# Patient Record
Sex: Male | Born: 1935 | Race: White | Hispanic: No | Marital: Married | Smoking: Former smoker
Health system: Southern US, Community
[De-identification: ages and names within clinical notes are randomized; demographics above are authoritative.]

## PROBLEM LIST (undated history)

## (undated) DIAGNOSIS — F1010 Alcohol abuse, uncomplicated: Secondary | ICD-10-CM

## (undated) DIAGNOSIS — I10 Essential (primary) hypertension: Secondary | ICD-10-CM

## (undated) DIAGNOSIS — E119 Type 2 diabetes mellitus without complications: Secondary | ICD-10-CM

## (undated) DIAGNOSIS — E785 Hyperlipidemia, unspecified: Secondary | ICD-10-CM

## (undated) DIAGNOSIS — F0280 Dementia in other diseases classified elsewhere without behavioral disturbance: Secondary | ICD-10-CM

---

## 2000-12-05 ENCOUNTER — Encounter: Admission: RE | Admit: 2000-12-05 | Discharge: 2000-12-05 | Payer: Self-pay | Admitting: Internal Medicine

## 2000-12-05 ENCOUNTER — Encounter: Payer: Self-pay | Admitting: Internal Medicine

## 2001-02-03 ENCOUNTER — Ambulatory Visit (HOSPITAL_COMMUNITY): Admission: RE | Admit: 2001-02-03 | Discharge: 2001-02-03 | Payer: Self-pay | Admitting: *Deleted

## 2019-07-21 ENCOUNTER — Inpatient Hospital Stay (HOSPITAL_COMMUNITY): Payer: Medicare Other

## 2019-07-21 ENCOUNTER — Inpatient Hospital Stay (HOSPITAL_COMMUNITY)
Admission: EM | Admit: 2019-07-21 | Discharge: 2019-08-02 | DRG: 521 | Disposition: A | Discharge status: Skilled Nursing Facility | Payer: Medicare Other | Source: Skilled Nursing Facility | Attending: Student | Admitting: Student

## 2019-07-21 ENCOUNTER — Encounter (HOSPITAL_COMMUNITY): Payer: Self-pay

## 2019-07-21 ENCOUNTER — Emergency Department (HOSPITAL_COMMUNITY): Payer: Medicare Other

## 2019-07-21 DIAGNOSIS — F1010 Alcohol abuse, uncomplicated: Secondary | ICD-10-CM | POA: Diagnosis present

## 2019-07-21 DIAGNOSIS — G301 Alzheimer's disease with late onset: Secondary | ICD-10-CM | POA: Diagnosis present

## 2019-07-21 DIAGNOSIS — J810 Acute pulmonary edema: Secondary | ICD-10-CM

## 2019-07-21 DIAGNOSIS — I4891 Unspecified atrial fibrillation: Secondary | ICD-10-CM | POA: Diagnosis not present

## 2019-07-21 DIAGNOSIS — R54 Age-related physical debility: Secondary | ICD-10-CM | POA: Diagnosis present

## 2019-07-21 DIAGNOSIS — I10 Essential (primary) hypertension: Secondary | ICD-10-CM | POA: Diagnosis present

## 2019-07-21 DIAGNOSIS — L89151 Pressure ulcer of sacral region, stage 1: Secondary | ICD-10-CM | POA: Diagnosis present

## 2019-07-21 DIAGNOSIS — N179 Acute kidney failure, unspecified: Secondary | ICD-10-CM | POA: Diagnosis not present

## 2019-07-21 DIAGNOSIS — E1165 Type 2 diabetes mellitus with hyperglycemia: Secondary | ICD-10-CM | POA: Diagnosis present

## 2019-07-21 DIAGNOSIS — J189 Pneumonia, unspecified organism: Secondary | ICD-10-CM | POA: Diagnosis not present

## 2019-07-21 DIAGNOSIS — S72031A Displaced midcervical fracture of right femur, initial encounter for closed fracture: Secondary | ICD-10-CM | POA: Diagnosis not present

## 2019-07-21 DIAGNOSIS — I5021 Acute systolic (congestive) heart failure: Secondary | ICD-10-CM | POA: Diagnosis not present

## 2019-07-21 DIAGNOSIS — G9341 Metabolic encephalopathy: Secondary | ICD-10-CM | POA: Diagnosis present

## 2019-07-21 DIAGNOSIS — Z515 Encounter for palliative care: Secondary | ICD-10-CM | POA: Diagnosis not present

## 2019-07-21 DIAGNOSIS — Z419 Encounter for procedure for purposes other than remedying health state, unspecified: Secondary | ICD-10-CM

## 2019-07-21 DIAGNOSIS — D696 Thrombocytopenia, unspecified: Secondary | ICD-10-CM | POA: Diagnosis not present

## 2019-07-21 DIAGNOSIS — I2510 Atherosclerotic heart disease of native coronary artery without angina pectoris: Secondary | ICD-10-CM | POA: Diagnosis present

## 2019-07-21 DIAGNOSIS — R0603 Acute respiratory distress: Secondary | ICD-10-CM

## 2019-07-21 DIAGNOSIS — R0602 Shortness of breath: Secondary | ICD-10-CM

## 2019-07-21 DIAGNOSIS — Z01810 Encounter for preprocedural cardiovascular examination: Secondary | ICD-10-CM | POA: Diagnosis not present

## 2019-07-21 DIAGNOSIS — Z66 Do not resuscitate: Secondary | ICD-10-CM | POA: Diagnosis present

## 2019-07-21 DIAGNOSIS — I4819 Other persistent atrial fibrillation: Secondary | ICD-10-CM | POA: Diagnosis present

## 2019-07-21 DIAGNOSIS — S72001A Fracture of unspecified part of neck of right femur, initial encounter for closed fracture: Secondary | ICD-10-CM | POA: Diagnosis present

## 2019-07-21 DIAGNOSIS — I509 Heart failure, unspecified: Secondary | ICD-10-CM | POA: Diagnosis not present

## 2019-07-21 DIAGNOSIS — J9601 Acute respiratory failure with hypoxia: Secondary | ICD-10-CM | POA: Diagnosis not present

## 2019-07-21 DIAGNOSIS — F0280 Dementia in other diseases classified elsewhere without behavioral disturbance: Secondary | ICD-10-CM | POA: Diagnosis present

## 2019-07-21 DIAGNOSIS — R32 Unspecified urinary incontinence: Secondary | ICD-10-CM | POA: Diagnosis present

## 2019-07-21 DIAGNOSIS — Z79899 Other long term (current) drug therapy: Secondary | ICD-10-CM

## 2019-07-21 DIAGNOSIS — Y95 Nosocomial condition: Secondary | ICD-10-CM | POA: Diagnosis present

## 2019-07-21 DIAGNOSIS — R52 Pain, unspecified: Secondary | ICD-10-CM

## 2019-07-21 DIAGNOSIS — I493 Ventricular premature depolarization: Secondary | ICD-10-CM | POA: Diagnosis not present

## 2019-07-21 DIAGNOSIS — W19XXXA Unspecified fall, initial encounter: Secondary | ICD-10-CM | POA: Diagnosis present

## 2019-07-21 DIAGNOSIS — E876 Hypokalemia: Secondary | ICD-10-CM | POA: Diagnosis not present

## 2019-07-21 DIAGNOSIS — T148XXA Other injury of unspecified body region, initial encounter: Secondary | ICD-10-CM | POA: Diagnosis present

## 2019-07-21 DIAGNOSIS — E875 Hyperkalemia: Secondary | ICD-10-CM | POA: Diagnosis not present

## 2019-07-21 DIAGNOSIS — Z885 Allergy status to narcotic agent status: Secondary | ICD-10-CM

## 2019-07-21 DIAGNOSIS — Z20822 Contact with and (suspected) exposure to covid-19: Secondary | ICD-10-CM | POA: Diagnosis not present

## 2019-07-21 DIAGNOSIS — Y92122 Bedroom in nursing home as the place of occurrence of the external cause: Secondary | ICD-10-CM

## 2019-07-21 DIAGNOSIS — Z5309 Procedure and treatment not carried out because of other contraindication: Secondary | ICD-10-CM | POA: Diagnosis not present

## 2019-07-21 DIAGNOSIS — S72009A Fracture of unspecified part of neck of unspecified femur, initial encounter for closed fracture: Secondary | ICD-10-CM | POA: Diagnosis present

## 2019-07-21 DIAGNOSIS — E785 Hyperlipidemia, unspecified: Secondary | ICD-10-CM | POA: Diagnosis present

## 2019-07-21 DIAGNOSIS — Z87891 Personal history of nicotine dependence: Secondary | ICD-10-CM

## 2019-07-21 DIAGNOSIS — R109 Unspecified abdominal pain: Secondary | ICD-10-CM

## 2019-07-21 DIAGNOSIS — E440 Moderate protein-calorie malnutrition: Secondary | ICD-10-CM | POA: Diagnosis present

## 2019-07-21 DIAGNOSIS — Z6823 Body mass index (BMI) 23.0-23.9, adult: Secondary | ICD-10-CM

## 2019-07-21 DIAGNOSIS — L8990 Pressure ulcer of unspecified site, unspecified stage: Secondary | ICD-10-CM | POA: Insufficient documentation

## 2019-07-21 DIAGNOSIS — N401 Enlarged prostate with lower urinary tract symptoms: Secondary | ICD-10-CM | POA: Diagnosis not present

## 2019-07-21 DIAGNOSIS — Z888 Allergy status to other drugs, medicaments and biological substances status: Secondary | ICD-10-CM

## 2019-07-21 DIAGNOSIS — I517 Cardiomegaly: Secondary | ICD-10-CM | POA: Diagnosis not present

## 2019-07-21 DIAGNOSIS — A419 Sepsis, unspecified organism: Secondary | ICD-10-CM | POA: Diagnosis present

## 2019-07-21 DIAGNOSIS — R1310 Dysphagia, unspecified: Secondary | ICD-10-CM | POA: Diagnosis present

## 2019-07-21 DIAGNOSIS — Z9181 History of falling: Secondary | ICD-10-CM

## 2019-07-21 DIAGNOSIS — R6510 Systemic inflammatory response syndrome (SIRS) of non-infectious origin without acute organ dysfunction: Secondary | ICD-10-CM | POA: Diagnosis present

## 2019-07-21 DIAGNOSIS — T17908A Unspecified foreign body in respiratory tract, part unspecified causing other injury, initial encounter: Secondary | ICD-10-CM

## 2019-07-21 DIAGNOSIS — E870 Hyperosmolality and hypernatremia: Secondary | ICD-10-CM | POA: Diagnosis not present

## 2019-07-21 DIAGNOSIS — R339 Retention of urine, unspecified: Secondary | ICD-10-CM | POA: Diagnosis not present

## 2019-07-21 DIAGNOSIS — R338 Other retention of urine: Secondary | ICD-10-CM | POA: Diagnosis present

## 2019-07-21 DIAGNOSIS — Y92129 Unspecified place in nursing home as the place of occurrence of the external cause: Secondary | ICD-10-CM | POA: Diagnosis not present

## 2019-07-21 DIAGNOSIS — I110 Hypertensive heart disease with heart failure: Secondary | ICD-10-CM | POA: Diagnosis present

## 2019-07-21 DIAGNOSIS — Z955 Presence of coronary angioplasty implant and graft: Secondary | ICD-10-CM

## 2019-07-21 DIAGNOSIS — F0391 Unspecified dementia with behavioral disturbance: Secondary | ICD-10-CM | POA: Diagnosis not present

## 2019-07-21 DIAGNOSIS — Z96649 Presence of unspecified artificial hip joint: Secondary | ICD-10-CM

## 2019-07-21 HISTORY — DX: Type 2 diabetes mellitus without complications: E11.9

## 2019-07-21 HISTORY — DX: Dementia in other diseases classified elsewhere, unspecified severity, without behavioral disturbance, psychotic disturbance, mood disturbance, and anxiety: F02.80

## 2019-07-21 HISTORY — DX: Alcohol abuse, uncomplicated: F10.10

## 2019-07-21 HISTORY — DX: Hyperlipidemia, unspecified: E78.5

## 2019-07-21 HISTORY — DX: Essential (primary) hypertension: I10

## 2019-07-21 LAB — BASIC METABOLIC PANEL
Anion gap: 11 (ref 5–15)
Anion gap: 16 — ABNORMAL HIGH (ref 5–15)
BUN: 22 mg/dL (ref 8–23)
BUN: 21 mg/dL (ref 8–23)
CO2: 26 mmol/L (ref 22–32)
CO2: 21 mmol/L — ABNORMAL LOW (ref 22–32)
Calcium: 8.9 mg/dL (ref 8.9–10.3)
Calcium: 9.1 mg/dL (ref 8.9–10.3)
Chloride: 98 mmol/L (ref 98–111)
Chloride: 102 mmol/L (ref 98–111)
Creatinine, Ser: 0.81 mg/dL (ref 0.61–1.24)
Creatinine, Ser: 0.99 mg/dL (ref 0.61–1.24)
GFR calc Af Amer: >60 mL/min (ref >60)
GFR calc Af Amer: >60 mL/min (ref >60)
GFR calc non Af Amer: >60 mL/min (ref >60)
GFR calc non Af Amer: >60 mL/min (ref >60)
Glucose, Bld: 126 mg/dL — ABNORMAL HIGH (ref 70–99)
Glucose, Bld: 149 mg/dL — ABNORMAL HIGH (ref 70–99)
Potassium: 5.4 mmol/L — ABNORMAL HIGH (ref 3.5–5.1)
Potassium: 4.3 mmol/L (ref 3.5–5.1)
Sodium: 135 mmol/L (ref 135–145)
Sodium: 139 mmol/L (ref 135–145)

## 2019-07-21 LAB — TROPONIN I (HIGH SENSITIVITY)
Troponin I (High Sensitivity): 29 ng/L — ABNORMAL HIGH (ref <18)
Troponin I (High Sensitivity): 32 ng/L — ABNORMAL HIGH (ref <18)

## 2019-07-21 LAB — COMPREHENSIVE METABOLIC PANEL
ALT: 14 U/L (ref 0–44)
AST: 27 U/L (ref 15–41)
Albumin: 3.5 g/dL (ref 3.5–5.0)
Alkaline Phosphatase: 80 U/L (ref 38–126)
Anion gap: 13 (ref 5–15)
BUN: 18 mg/dL (ref 8–23)
CO2: 26 mmol/L (ref 22–32)
Calcium: 8.6 mg/dL — ABNORMAL LOW (ref 8.9–10.3)
Chloride: 99 mmol/L (ref 98–111)
Creatinine, Ser: 0.98 mg/dL (ref 0.61–1.24)
GFR calc Af Amer: >60 mL/min (ref >60)
GFR calc non Af Amer: >60 mL/min (ref >60)
Glucose, Bld: 150 mg/dL — ABNORMAL HIGH (ref 70–99)
Potassium: 5 mmol/L (ref 3.5–5.1)
Sodium: 138 mmol/L (ref 135–145)
Total Bilirubin: 2.9 mg/dL — ABNORMAL HIGH (ref 0.3–1.2)
Total Protein: 5.9 g/dL — ABNORMAL LOW (ref 6.5–8.1)

## 2019-07-21 LAB — PROTIME-INR
INR: 1.2 (ref 0.8–1.2)
Prothrombin Time: 14.7 seconds (ref 11.4–15.2)

## 2019-07-21 LAB — BRAIN NATRIURETIC PEPTIDE: B Natriuretic Peptide: 655.7 pg/mL — ABNORMAL HIGH (ref 0.0–100.0)

## 2019-07-21 LAB — CBC WITH DIFFERENTIAL/PLATELET
Abs Immature Granulocytes: 0.12 10*3/uL — ABNORMAL HIGH (ref 0.00–0.07)
Abs Immature Granulocytes: 0.14 10*3/uL — ABNORMAL HIGH (ref 0.00–0.07)
Basophils Absolute: 0 10*3/uL (ref 0.0–0.1)
Basophils Absolute: 0.1 10*3/uL (ref 0.0–0.1)
Basophils Relative: 0 %
Basophils Relative: 0 %
Eosinophils Absolute: 0 10*3/uL (ref 0.0–0.5)
Eosinophils Absolute: 0 10*3/uL (ref 0.0–0.5)
Eosinophils Relative: 0 %
Eosinophils Relative: 0 %
HCT: 43.4 % (ref 39.0–52.0)
HCT: 45.1 % (ref 39.0–52.0)
Hemoglobin: 14.7 g/dL (ref 13.0–17.0)
Hemoglobin: 14.6 g/dL (ref 13.0–17.0)
Immature Granulocytes: 1 %
Immature Granulocytes: 1 %
Lymphocytes Relative: 6 %
Lymphocytes Relative: 7 %
Lymphs Abs: 1.1 10*3/uL (ref 0.7–4.0)
Lymphs Abs: 1.4 10*3/uL (ref 0.7–4.0)
MCH: 32.7 pg (ref 26.0–34.0)
MCH: 32.7 pg (ref 26.0–34.0)
MCHC: 33.9 g/dL (ref 30.0–36.0)
MCHC: 32.4 g/dL (ref 30.0–36.0)
MCV: 96.4 fL (ref 80.0–100.0)
MCV: 100.9 fL — ABNORMAL HIGH (ref 80.0–100.0)
Monocytes Absolute: 1.5 10*3/uL — ABNORMAL HIGH (ref 0.1–1.0)
Monocytes Absolute: 1.6 10*3/uL — ABNORMAL HIGH (ref 0.1–1.0)
Monocytes Relative: 9 %
Monocytes Relative: 9 %
Neutro Abs: 14.5 10*3/uL — ABNORMAL HIGH (ref 1.7–7.7)
Neutro Abs: 15.4 10*3/uL — ABNORMAL HIGH (ref 1.7–7.7)
Neutrophils Relative %: 84 %
Neutrophils Relative %: 83 %
Platelets: 200 10*3/uL (ref 150–400)
Platelets: 209 10*3/uL (ref 150–400)
RBC: 4.5 MIL/uL (ref 4.22–5.81)
RBC: 4.47 MIL/uL (ref 4.22–5.81)
RDW: 14.5 % (ref 11.5–15.5)
RDW: 14.9 % (ref 11.5–15.5)
WBC: 17.2 10*3/uL — ABNORMAL HIGH (ref 4.0–10.5)
WBC: 18.6 10*3/uL — ABNORMAL HIGH (ref 4.0–10.5)
nRBC: 0 % (ref 0.0–0.2)
nRBC: 0 % (ref 0.0–0.2)

## 2019-07-21 LAB — TYPE AND SCREEN
ABO/RH(D): O POS
Antibody Screen: NEGATIVE

## 2019-07-21 LAB — URINALYSIS, ROUTINE W REFLEX MICROSCOPIC
Bilirubin Urine: NEGATIVE
Glucose, UA: NEGATIVE mg/dL
Hgb urine dipstick: NEGATIVE
Ketones, ur: 20 mg/dL — AB
Leukocytes,Ua: NEGATIVE
Nitrite: NEGATIVE
Protein, ur: 30 mg/dL — AB
Specific Gravity, Urine: 1.031 — ABNORMAL HIGH (ref 1.005–1.030)
pH: 5 (ref 5.0–8.0)

## 2019-07-21 LAB — RESPIRATORY PANEL BY RT PCR (FLU A&B, COVID)
Influenza A by PCR: NEGATIVE
Influenza B by PCR: NEGATIVE
SARS Coronavirus 2 by RT PCR: NEGATIVE

## 2019-07-21 LAB — VALPROIC ACID LEVEL: Valproic Acid Lvl: <10 ug/mL — ABNORMAL LOW (ref 50.0–100.0)

## 2019-07-21 LAB — GLUCOSE, CAPILLARY
Glucose-Capillary: 162 mg/dL — ABNORMAL HIGH (ref 70–99)
Glucose-Capillary: 152 mg/dL — ABNORMAL HIGH (ref 70–99)

## 2019-07-21 LAB — TSH: TSH: 5.813 u[IU]/mL — ABNORMAL HIGH (ref 0.350–4.500)

## 2019-07-21 LAB — PROCALCITONIN: Procalcitonin: <0.1 ng/mL

## 2019-07-21 LAB — ABO/RH: ABO/RH(D): O POS

## 2019-07-21 MED ORDER — LORAZEPAM 2 MG/ML IJ SOLN
0.5000 mg | INTRAMUSCULAR | Status: DC | PRN
  Administered 2019-07-21 – 2019-07-22 (×4): 1 mg via INTRAVENOUS
  Filled 2019-07-21 (×4): qty 1

## 2019-07-21 MED ORDER — TRANEXAMIC ACID-NACL 1000-0.7 MG/100ML-% IV SOLN
1000.0000 mg | INTRAVENOUS | Status: AC
  Filled 2019-07-21 (×2): qty 100

## 2019-07-21 MED ORDER — PRO-STAT SUGAR FREE PO LIQD
30.0000 mL | Freq: Three times a day (TID) | ORAL | Status: DC
  Administered 2019-07-23 – 2019-07-31 (×17): 30 mL via ORAL
  Filled 2019-07-21 (×24): qty 30

## 2019-07-21 MED ORDER — METOPROLOL TARTRATE 5 MG/5ML IV SOLN
2.5000 mg | Freq: Four times a day (QID) | INTRAVENOUS | Status: DC | PRN
  Administered 2019-07-21: 21:00:00 2.5 mg via INTRAVENOUS
  Filled 2019-07-21: qty 5

## 2019-07-21 MED ORDER — ADULT MULTIVITAMIN W/MINERALS CH
1.0000 | ORAL_TABLET | Freq: Every day | ORAL | Status: DC
  Administered 2019-07-24 – 2019-07-25 (×2): 1 via ORAL
  Filled 2019-07-21 (×3): qty 1

## 2019-07-21 MED ORDER — FUROSEMIDE 10 MG/ML IJ SOLN
40.0000 mg | Freq: Once | INTRAMUSCULAR | Status: AC
  Administered 2019-07-21: 05:00:00 40 mg via INTRAVENOUS
  Filled 2019-07-21: qty 4

## 2019-07-21 MED ORDER — CEFAZOLIN SODIUM-DEXTROSE 2-4 GM/100ML-% IV SOLN
2.0000 g | INTRAVENOUS | Status: DC
  Filled 2019-07-21 (×2): qty 100

## 2019-07-21 MED ORDER — MORPHINE SULFATE (PF) 2 MG/ML IV SOLN
1.0000 mg | INTRAVENOUS | Status: DC | PRN
  Administered 2019-07-21 – 2019-07-26 (×4): 1 mg via INTRAVENOUS
  Filled 2019-07-21 (×5): qty 1

## 2019-07-21 MED ORDER — MORPHINE SULFATE (PF) 2 MG/ML IV SOLN
0.5000 mg | INTRAVENOUS | Status: DC | PRN
  Administered 2019-07-21: 08:00:00 0.5 mg via INTRAVENOUS
  Filled 2019-07-21: qty 1

## 2019-07-21 MED ORDER — ENSURE PRE-SURGERY PO LIQD
296.0000 mL | Freq: Once | ORAL | Status: DC
  Filled 2019-07-21: qty 296

## 2019-07-21 MED ORDER — MORPHINE SULFATE (PF) 2 MG/ML IV SOLN
1.0000 mg | Freq: Once | INTRAVENOUS | Status: AC
  Administered 2019-07-21: 09:00:00 1 mg via INTRAVENOUS

## 2019-07-21 MED ORDER — ENSURE ENLIVE PO LIQD
237.0000 mL | Freq: Three times a day (TID) | ORAL | Status: DC
  Administered 2019-07-23 – 2019-07-27 (×6): 237 mL via ORAL

## 2019-07-21 MED ORDER — TRANEXAMIC ACID 1000 MG/10ML IV SOLN
2000.0000 mg | INTRAVENOUS | Status: DC
  Filled 2019-07-21: qty 20

## 2019-07-21 MED ORDER — METOPROLOL TARTRATE 25 MG PO TABS
25.0000 mg | ORAL_TABLET | Freq: Two times a day (BID) | ORAL | Status: DC
  Administered 2019-07-21: 12:00:00 25 mg via ORAL
  Filled 2019-07-21 (×2): qty 1

## 2019-07-21 MED ORDER — METOPROLOL TARTRATE 5 MG/5ML IV SOLN: 5.0000 mg | Freq: Four times a day (QID) | INTRAVENOUS | Status: DC | PRN

## 2019-07-21 MED ORDER — POVIDONE-IODINE 10 % EX SWAB: 2.0000 "application " | Freq: Once | CUTANEOUS | Status: DC

## 2019-07-21 MED ORDER — FUROSEMIDE 10 MG/ML IJ SOLN
40.0000 mg | Freq: Once | INTRAMUSCULAR | Status: AC
  Administered 2019-07-21: 16:00:00 40 mg via INTRAVENOUS
  Filled 2019-07-21: qty 4

## 2019-07-21 MED ORDER — TRANEXAMIC ACID-NACL 1000-0.7 MG/100ML-% IV SOLN
1000.0000 mg | INTRAVENOUS | Status: DC
  Filled 2019-07-21: qty 100

## 2019-07-21 NOTE — Progress Notes (Addendum)
Echo attempted. Patient agitated and HR in too high at this time. Will attempt once HR is in a reasonable range.

## 2019-07-21 NOTE — Progress Notes (Signed)
S- Went to see pt. He had de-saturated during day, was working harder to breathe earlier. CXR showed edema. Received only 1 dose of Lasix. No complaints from pt, hopefully getting surgery tomorrow. O- +LE edema. Pleasantly demented with me. Faint rales at bases. HR irreg irreg, tachy. Appears comfortable.  UA unremarkable, CXR not suggestive of PNA, no fevers. A- Acute on chronic HF, ARF, A fib, Hip fx, dysphagia P- will give another dose of Lasix. O2 as needed. I think once we get some fluid off of him, his HR will improve. Additionally, I think pain is contributing to his HR/RR. Hopefully can get him calm enough to get echo. Will defer to cards team regarding metoprolol and difficulty swallowing.   Jilda Roche Magic Mohler 3:51 PM 07/21/19

## 2019-07-21 NOTE — Progress Notes (Signed)
CSW contacted [facility]. Spoke with Alphonzo Lemmings [phone] who verified [facility] is [facility].   [address] [place], [place]  [phone]

## 2019-07-21 NOTE — ED Provider Notes (Signed)
TIME SEEN: 2:41 AM  CHIEF COMPLAINT: Fall, right hip fracture  HPI: Patient is an 86 year old male with history of Alzheimer's dementia, hypertension, diabetes, hyperlipidemia who presents from [place] at [facility] after a fall.  Spoke with Renea Ee who has been caring for patient [phone], [phone]) who states over the past several days he has had several falls.  He last fell last night and was complaining of hip pain.  This fall was unwitnessed and he was found sitting on his bottom between the wall and the bed.  It is unclear if he hit his head.  He is not on blood thinners.  X-ray was obtained of the right hip at the nursing facility which was concerning for right femoral neck fracture and he was transferred here.  Spoke with patient's son Fayrene Fearing [phone]) who is patient's power of attorney.  He states that patient started having cough and shortness of breath 2 days ago.  Was initially diagnosed with pneumonia but then felt thought to be secondary to volume overload.  He is not currently on antibiotics.  States he had a fall last night and they told him that patient had a left hip fracture.  He reports that the patient normally is able to ambulate with a walker.  It is not abnormal for him to have frequent falls.  He states patient is a DNR.  They would want surgical intervention if needed.  ROS: Level 5 caveat secondary to dementia  PAST MEDICAL HISTORY/PAST SURGICAL HISTORY:  Past Medical History:  Diagnosis Date  . Alcohol abuse   . Alzheimer's dementia, late onset (HCC)   . Hyperlipemia   . Hypertension   . Type 2 diabetes mellitus (HCC)     MEDICATIONS:  Prior to Admission medications   Not on File    ALLERGIES:  Allergies  Allergen Reactions  . Atorvastatin   . Codeine     SOCIAL HISTORY:  Social History   Tobacco Use  . Smoking status: Not on file  Substance Use Topics  . Alcohol use: Not on file    FAMILY HISTORY: No family  history on file.  EXAM: BP (!) 151/96 (BP Location: Right Arm)   Pulse (!) 108   Temp 98.6 F (37 C) (Oral)   Resp (!) 30   SpO2 94%  CONSTITUTIONAL: Alert.  Patient is elderly and demented.  He is currently rambling.  Unable to answer questions or follow commands. HEAD: Normocephalic; atraumatic EYES: Conjunctivae clear, PERRL, EOMI ENT: normal nose; no rhinorrhea; moist mucous membranes; pharynx without lesions noted; no dental injury; no septal hematoma NECK: Supple, no meningismus, no LAD; no midline spinal tenderness, step-off or deformity; trachea midline CARD: Irregularly irregular and minimally tachycardic; S1 and S2 appreciated; no murmurs, no clicks, no rubs, no gallops RESP: Normal chest excursion without splinting or tachypnea; breath sounds clear and equal bilaterally; no wheezes, no rhonchi, no rales; no hypoxia or respiratory distress CHEST:  chest wall stable, no crepitus or deformity, patient has ecchymosis to the anterior chest, nontender to palpation; no flail chest ABD/GI: Normal bowel sounds; non-distended; soft, non-tender, no rebound, no guarding; no ecchymosis or other lesions noted PELVIS:  stable, tender over the right hip and this leg is shortened and externally rotated BACK:  The back appears normal and is non-tender to palpation, there is no CVA tenderness; no midline spinal tenderness, step-off or deformity, bruising noted to the posterior right shoulder EXT: No other deformity noted to his extremities other than right hip  pain with external rotation and shortening.  His extremities are all warm and well-perfused.  He has 2+ peripheral edema to the midshin bilaterally. SKIN: Normal color for age and race; warm NEURO: Moves all extremities equally, rambling but speech is clear, no facial asymmetry  MEDICAL DECISION MAKING: Patient here with unwitnessed fall.  Concern for right femoral neck fracture at nursing home.  Clinically it does appear he has a hip fracture.   He appears to be perfusing this extremity well.  Does not have a local orthopedist.  Will obtain imaging of the head, cervical spine given fall unwitnessed.  Will obtain x-rays of the right shoulder, chest and hip.  He does not appear to be in discomfort at this time.  Will obtain screening labs, urine, EKG and Covid swab.  ED PROGRESS: X-ray of the hip shows transcervical right femoral neck fracture with varus angulation and external rotation.  Will discuss with orthopedic surgery on-call.  His son Fayrene Fearing reports he has seen Dr. Edmon Crape in [facility] years ago but does not have another local orthopedist and never has had orthopedic surgery here.  Right shoulder x-ray shows soft tissue thickening at the right [facility] joint that could be degenerative in nature versus a type I AC injury.  He does not seem to have point tenderness to this area.  No other acute abnormality noted.  Chest x-ray appears to be volume overloaded.  He does have a wet cough here.  Given this and his peripheral edema, will give Lasix and add on troponin and BNP.  Patient is in atrial fibrillation.  CT head and cervical spine show no acute abnormality.  Labs show leukocytosis with left shift which may be reactive in nature.  Chest x-ray does not show infection.  He is not febrile here.  Urinalysis, Covid pending.  Patient's potassium is also slightly elevated at 5.4 with normal creatinine.  Will treat with IV Lasix.  No EKG changes.  4:22 AM  Spoke with Dr. August Saucer on-call for orthopedic surgery.  Appreciate his help.  They will see patient in the morning.  We will keep him n.p.o. at this time.  4:31 AM Discussed patient's case with hospitalist, Dr. Toniann Fail.  I have recommended admission and patient (and family if present) agree with this plan. Admitting physician will place admission orders.   I reviewed all nursing notes, vitals, pertinent previous records and interpreted all EKGs, lab and urine results, imaging (as  available).   Updated patient's son by phone.  Son is not aware of h/o CHF or a fib.  Has h/o CAD with stent.  Confirmed that patient is DNR.     EKG Interpretation  Date/Time:  Wednesday July 21 2019 02:00:21 EST Ventricular Rate:  111 PR Interval:    QRS Duration: 96 QT Interval:  341 QTC Calculation: 464 R Axis:   -48 Text Interpretation: Atrial fibrillation Left ventricular hypertrophy Inferior infarct, old Anterior infarct, old Lateral leads are also involved No old tracing to compare Confirmed by Charlissa Petros, Baxter Hire [id number]) on 07/21/2019 2:38:46 AM       CRITICAL CARE Performed by: Baxter Hire Soriya Worster   Total critical care time: 45 minutes  Critical care time was exclusive of separately billable procedures and treating other patients.  Critical care was necessary to treat or prevent imminent or life-threatening deterioration.  Critical care was time spent personally by me on the following activities: development of treatment plan with patient and/or surrogate as well as nursing, discussions with consultants, evaluation of patient's  response to treatment, examination of patient, obtaining history from patient or surrogate, ordering and performing treatments and interventions, ordering and review of laboratory studies, ordering and review of radiographic studies, pulse oximetry and re-evaluation of patient's condition.  Doctor L Servidio Sr. was evaluated in Emergency Department on 07/21/2019 for the symptoms described in the history of present illness. He was evaluated in the context of the global COVID-19 pandemic, which necessitated consideration that the patient might be at risk for infection with the SARS-CoV-2 virus that causes COVID-19. Institutional protocols and algorithms that pertain to the evaluation of patients at risk for COVID-19 are in a state of rapid change based on information released by regulatory bodies including the CDC and federal and state organizations. These policies  and algorithms were followed during the patient's care in the ED.  Patient was seen wearing N95, face shield, gloves.     Keyairra Kolinski, Delice Bison, DO 07/21/19 [phone]

## 2019-07-21 NOTE — ED Triage Notes (Signed)
Fall right hip fx.

## 2019-07-21 NOTE — Progress Notes (Signed)
Initial Nutrition Assessment  RD working remotely.   DOCUMENTATION CODES:   Not applicable  INTERVENTION:   Ensure Enlive po TID, each supplement provides 350 kcal and 20 grams of protein  MVI with minerals daily  17ml Pro-stat TID  NUTRITION DIAGNOSIS:   Increased nutrient needs related to hip fracture as evidenced by estimated needs.   GOAL:   Patient will meet greater than or equal to 90% of their needs   MONITOR:   PO intake, Supplement acceptance, Weight trends, Labs, I & O's  REASON FOR ASSESSMENT:   Consult Hip fracture protocol  ASSESSMENT:   Pt with a PMH significant for EtOH abuse, DM, CAD, HLD, HTN, dementia, recent compression fracture 3-4 months ago who was admitted s/p fall with right hip fracture, acute CHF, new onset AFib.   Pt noted to be a poor historian. Pt from a nursing facility. Pt's son reports pt is disoriented x4 at baseline.   Per MD, plan is for pt to have surgery tomorrow if medically cleared by cardiology today.  No PO intake documented. Discussed pt with RN who reports pt has not been taking much PO.  Medications reviewed. Labs reviewed.   No wts available from this admission. Pt weighed 101.2kg on 03/30/2019 per Care Everywhere.  NUTRITION - FOCUSED PHYSICAL EXAM:  Deferred, RD working remotely.   Diet Order:   Diet Order            Diet regular Room service appropriate? Yes; Fluid consistency: Thin  Diet effective now              EDUCATION NEEDS:   Not appropriate for education at this time  Last BM:  unknown  Height:   Ht Readings from Last 1 Encounters:  No data found for Ht    Weight:   Wt Readings from Last 1 Encounters:  No data found for Wt    BMI:  There is no height or weight on file to calculate BMI.  Estimated Nutritional Needs:   Kcal:  1900-2100  Protein:  100-115 grams  Fluid:  >1.9L/d   Eugene Gavia, MS, RD, LDN RD pager number and weekend/on-call pager number located in  Amion.

## 2019-07-21 NOTE — Progress Notes (Signed)
Patient's MEWs changed red due to increased HR and RR. HR 130-140s, RR mid 20's. Patient is restless, but this is not an acute change. Protocol started. Charge RN, Rapid Response and MD notified of red mews score. Vitals q 15 minutes. Per MD continue monitoring patient.

## 2019-07-21 NOTE — Progress Notes (Signed)
Spoke with son, Fayrene Fearing, about right hip fracture.  Will plan for surgery Thursday pending medical and cardiac clearance.  Informed consent obtained from son.  Full consult note to follow.  Mayra Reel, MD [facility] [phone] 8:16 AM

## 2019-07-21 NOTE — ED Notes (Signed)
Pt came to the ED via [facility] Pt conscious, breathing, and A&Ox2. EMS ndorses "The patient fell down out of his bed and the pt has right lower extremity external rotation and shortening. Chest rise and fall equally with non-labored breathing. Lungs clear apex to base. Abd soft and non-tender. Pt denies chest pain, n/v/d, shortness of breath, and f/c. PIVC placed on the left arm which was placed by EMS and had positive blood return and flushed without pain or infiltration. Blood collected, labeled, and sent to lab. Bed in lowest position with call light within reach. Pt on continuous blood pressure, pulse ox, and cardiac monitor. Will continue to monitor. Awaiting MD eval. No distress noted.

## 2019-07-21 NOTE — ED Notes (Signed)
Pt resting in bed. Pt denies new or worsening complaints. Will continue to monitor. No distress noted. Pt on continuous monitoring via blood pressure, pulse ox, and cardiac monitor.  

## 2019-07-21 NOTE — ED Notes (Signed)
Pt with condom cath on, IV secured with kerlex, pillow given to pt. Pt seems c

## 2019-07-21 NOTE — ED Notes (Signed)
Safety sitter bedside. 

## 2019-07-21 NOTE — Progress Notes (Signed)
Pt's son was contacted to determine pt's mental status baseline and determine if a family member could come visit/sit with patient to decrease confusion/aggitation. Pt's son Nicholas Trujillo stated he is normally disoriented x 4 at his baseline and does not recognize his family members. Nicholas Trujillo also stated he is unable to visit at this time due to his wife's medical conditions and risk of getting COVID. I have updated son Nicholas Trujillo on plan of care and pt's current status and received verbal consent with Emelda Brothers, RN for procedure tomorrow.   Pt's son called back a few minutes later requested that no medical information be given to his sister Nicholas Trujillo. Nicholas Trujillo is the POA and asked that no information be given or allow daughter to visit pt.

## 2019-07-21 NOTE — H&P (Signed)
History and Physical    Nicholas BRICKLEY Sr. OHY:[id number] DOB: 1929/01/04 DOA: 07/21/2019  PCP: Nicholas Trujillo, No Pcp Per  Nicholas Trujillo coming from: Nursing home.  History obtained from Nicholas Trujillo's son who is also the healthcare power of attorney.  Chief Complaint: Fall.  HPI: CAUY Nicholas Sr. is a 86 y.o. male with history of alcohol abuse previous history of diabetes CAD status post stenting many years ago with history of alcohol abuse who had a compression fracture around 3 to 4 months ago and was admitted at [facility] and subsequently discharged to rehab and has been at this time permanently in a nursing facility due to recurrent falls and dementia prior to which Nicholas Trujillo used to drink alcohol every day and has not had any alcohol in the last 3 months was brought to the ER after Nicholas Trujillo had an unwitnessed fall at the nursing facility.  2 days ago as per the Nicholas Trujillo's son Nicholas Trujillo was found to be having some shortness of breath and was diagnosed with possible pneumonia.  Was placed on antibiotics.  Nicholas Trujillo does not provide good history but does not complain of any chest pain.  Exact same circumstances of the fall is not clear.  ED Course: In the ER x-rays revealed right hip fracture and also shows pulmonary edema with new onset A. fib with RVR.  Was given Lasix 40 mg IV.  Heart rate is improved without any intervention so far.  Labs reveal potassium 5.4 leukocytosis of 17.2 high-sensitivity troponin of 29 Covid test was negative.  CT head and C-spine was unremarkable.  On-call orthopedic surgeon has been consulted Nicholas Trujillo admitted for further management.  On exam Nicholas Trujillo is not in distress has some skin reaction on the groin area likely from yeast.  Review of Systems: As per HPI, rest all negative.   Past Medical History:  Diagnosis Date  . Alcohol abuse   . Alzheimer's dementia, late onset (HCC)   . Hyperlipemia   . Hypertension   . Type 2 diabetes mellitus (HCC)     Past Surgical  History:  Procedure Laterality Date  . CHOLECYSTECTOMY    . CORONARY ANGIOPLASTY    . PROSTATE SURGERY       reports that he has quit smoking. He has never used smokeless tobacco. He reports previous alcohol use. No history on file for drug.  Allergies  Allergen Reactions  . Atorvastatin Other (See Comments)    Myalgia Other reaction(s): Myalgias (intolerance)   . Codeine Nausea And Vomiting    Family History  Problem Relation Age of Onset  . Cancer Mother     Prior to Admission medications   Medication Sig Start Date End Date Taking? Authorizing Provider  acetaminophen (TYLENOL) 500 MG tablet Take 1,000 mg by mouth 3 (three) times daily.   Yes [provider]  divalproex (DEPAKOTE SPRINKLE) 125 MG capsule Take 250 mg by mouth at bedtime.   Yes [provider]  escitalopram (LEXAPRO) 10 MG tablet Take 10 mg by mouth at bedtime.   Yes [provider]  ipratropium-albuterol (DUONEB) 0.5-2.5 (3) MG/3ML SOLN Take 3 mLs by nebulization every 8 (eight) hours.   Yes [provider]  Lidocaine-Menthol [facility] PAIN RELIEF) 3.99-1.25 % PTCH Apply 1 patch topically at bedtime. To low back.  On 12 hours and off 12 hours   Yes [provider]  LORazepam (ATIVAN) 0.5 MG tablet Take 0.5 mg by mouth at bedtime.   Yes [provider]  memantine (NAMENDA) 5  MG tablet Take 5 mg by mouth 2 (two) times daily.   Yes [provider]  traMADol (ULTRAM) 50 MG tablet Take 50 mg by mouth every 4 (four) hours as needed for moderate pain.   Yes [provider]    Physical Exam: Constitutional: Moderately built and nourished. Vitals:   07/21/19 0200 07/21/19 0300 07/21/19 0330  BP: (!) 151/96 123/68 (!) 151/89  Pulse: (!) 108 (!) 59 (!) 50  Resp: (!) 30 (!) 27   Temp: 98.6 F (37 C)    TempSrc: Oral    SpO2: 94% 93% 92%   Eyes: Anicteric no pallor. ENMT: No discharge from the ears eyes nose or mouth. Neck: No mass or.  No  neck rigidity. Respiratory: No rhonchi or crepitation. Cardiovascular: S1-S2 heard. Abdomen: Soft nontender bowel sounds present. Musculoskeletal: No edema.  No joint effusion pain on moving of right hip. Skin: Skin rash in the groin.  Sacral decubitus stage I. Neurologic: Alert awake oriented to his name moves all extremities. Psychiatric: Oriented to his name.   Labs on Admission: I have personally reviewed following labs and imaging studies  CBC: Recent Labs  Lab 07/21/19 0239  WBC 17.2*  NEUTROABS 14.5*  HGB 14.7  HCT 43.4  MCV 96.4  PLT 664   Basic Metabolic Panel: Recent Labs  Lab 07/21/19 0239  NA 135  K 5.4*  CL 98  CO2 26  GLUCOSE 126*  BUN 22  CREATININE 0.81  CALCIUM 8.9   GFR: CrCl cannot be calculated (Unknown ideal weight.). Liver Function Tests: No results for input(s): AST, ALT, ALKPHOS, BILITOT, PROT, ALBUMIN in the last 168 hours. No results for input(s): LIPASE, AMYLASE in the last 168 hours. No results for input(s): AMMONIA in the last 168 hours. Coagulation Profile: Recent Labs  Lab 07/21/19 0239  INR 1.2   Cardiac Enzymes: No results for input(s): CKTOTAL, CKMB, CKMBINDEX, TROPONINI in the last 168 hours. BNP (last 3 results) No results for input(s): PROBNP in the last 8760 hours. HbA1C: No results for input(s): HGBA1C in the last 72 hours. CBG: No results for input(s): GLUCAP in the last 168 hours. Lipid Profile: No results for input(s): CHOL, HDL, LDLCALC, TRIG, CHOLHDL, LDLDIRECT in the last 72 hours. Thyroid Function Tests: No results for input(s): TSH, T4TOTAL, FREET4, T3FREE, THYROIDAB in the last 72 hours. Anemia Panel: No results for input(s): VITAMINB12, FOLATE, FERRITIN, TIBC, IRON, RETICCTPCT in the last 72 hours. Urine analysis:    Component Value Date/Time   COLORURINE AMBER (A) 07/21/2019 0450   APPEARANCEUR CLEAR 07/21/2019 0450   LABSPEC 1.031 (H) 07/21/2019 0450   PHURINE 5.0 07/21/2019 0450   GLUCOSEU  NEGATIVE 07/21/2019 0450   HGBUR NEGATIVE 07/21/2019 0450   BILIRUBINUR NEGATIVE 07/21/2019 0450   KETONESUR 20 (A) 07/21/2019 0450   PROTEINUR 30 (A) 07/21/2019 0450   NITRITE NEGATIVE 07/21/2019 0450   LEUKOCYTESUR NEGATIVE 07/21/2019 0450   Sepsis Labs: @LABRCNTIP (procalcitonin:4,lacticidven:4) ) Recent Results (from the past 240 hour(s))  Respiratory Panel by RT PCR (Flu A&B, Covid) - Nasopharyngeal Swab     Status: None   Collection Time: 07/21/19  4:00 AM   Specimen: Nasopharyngeal Swab  Result Value Ref Range Status   SARS Coronavirus 2 by RT PCR NEGATIVE NEGATIVE Final    Comment: (NOTE) SARS-CoV-2 target nucleic acids are NOT DETECTED. The SARS-CoV-2 RNA is generally detectable in upper respiratoy specimens during the acute phase of infection. The lowest concentration of SARS-CoV-2 viral copies this assay can detect is 131  copies/mL. A negative result does not preclude SARS-Cov-2 infection and should not be used as the sole basis for treatment or other Nicholas Trujillo management decisions. A negative result may occur with  improper specimen collection/handling, submission of specimen other than nasopharyngeal swab, presence of viral mutation(s) within the areas targeted by this assay, and inadequate number of viral copies (<131 copies/mL). A negative result must be combined with clinical observations, Nicholas Trujillo history, and epidemiological information. The expected result is Negative. Fact Sheet for Patients:  https://www.moore.com/ Fact Sheet for Healthcare Providers:  https://www.young.biz/ This test is not yet ap proved or cleared by the [place] FDA and  has been authorized for detection and/or diagnosis of SARS-CoV-2 by FDA under an Emergency Use Authorization (EUA). This EUA will remain  in effect (meaning this test can be used) for the duration of the COVID-19 declaration under Section 564(b)(1) of the Act, 21 U.S.C. section  360bbb-3(b)(1), unless the authorization is terminated or revoked sooner.    Influenza A by PCR NEGATIVE NEGATIVE Final   Influenza B by PCR NEGATIVE NEGATIVE Final    Comment: (NOTE) The Xpert Xpress SARS-CoV-2/FLU/RSV assay is intended as an aid in  the diagnosis of influenza from Nasopharyngeal swab specimens and  should not be used as a sole basis for treatment. Nasal washings and  aspirates are unacceptable for Xpert Xpress SARS-CoV-2/FLU/RSV  testing. Fact Sheet for Patients: https://www.moore.com/ Fact Sheet for Healthcare Providers: https://www.young.biz/ This test is not yet approved or cleared by the [place] FDA and  has been authorized for detection and/or diagnosis of SARS-CoV-2 by  FDA under an Emergency Use Authorization (EUA). This EUA will remain  in effect (meaning this test can be used) for the duration of the  Covid-19 declaration under Section 564(b)(1) of the Act, 21  U.S.C. section 360bbb-3(b)(1), unless the authorization is  terminated or revoked. Performed at [facility], [address], [place], [place] [address]      Radiological Exams on Admission: CT Head Wo Contrast  Result Date: 07/21/2019 CLINICAL DATA:  Fall EXAM: CT HEAD WITHOUT CONTRAST CT CERVICAL SPINE WITHOUT CONTRAST TECHNIQUE: Multidetector CT imaging of the head and cervical spine was performed following the standard protocol without intravenous contrast. Multiplanar CT image reconstructions of the cervical spine were also generated. COMPARISON:  None. FINDINGS: CT HEAD FINDINGS Brain: There is no mass, hemorrhage or extra-axial collection. There is generalized atrophy without lobar predilection. There is hypoattenuation of the periventricular white matter, most commonly indicating chronic ischemic microangiopathy. Vascular: Atherosclerotic calcification of the internal carotid arteries at the skull base. No abnormal hyperdensity of the major  intracranial arteries or dural venous sinuses. Skull: The visualized skull base, calvarium and extracranial soft tissues are normal. Sinuses/Orbits: No fluid levels or advanced mucosal thickening of the visualized paranasal sinuses. No mastoid or middle ear effusion. The orbits are normal. CT CERVICAL SPINE FINDINGS Alignment: No static subluxation. Facets are aligned. Occipital condyles are normally positioned. Skull base and vertebrae: No acute fracture. Soft tissues and spinal canal: No prevertebral fluid or swelling. No visible canal hematoma. Disc levels: No advanced spinal canal or neural foraminal stenosis. Upper chest: No pneumothorax, pulmonary nodule or pleural effusion. Other: Normal visualized paraspinal cervical soft tissues. IMPRESSION: 1. No acute intracranial abnormality. 2. No fracture or static subluxation of the cervical spine. 3. Chronic ischemic microangiopathy and generalized volume loss. Electronically Signed   By: Deatra Robinson M.D.   On: 07/21/2019 03:48   CT Cervical Spine Wo Contrast  Result Date:  07/21/2019 CLINICAL DATA:  Fall EXAM: CT HEAD WITHOUT CONTRAST CT CERVICAL SPINE WITHOUT CONTRAST TECHNIQUE: Multidetector CT imaging of the head and cervical spine was performed following the standard protocol without intravenous contrast. Multiplanar CT image reconstructions of the cervical spine were also generated. COMPARISON:  None. FINDINGS: CT HEAD FINDINGS Brain: There is no mass, hemorrhage or extra-axial collection. There is generalized atrophy without lobar predilection. There is hypoattenuation of the periventricular white matter, most commonly indicating chronic ischemic microangiopathy. Vascular: Atherosclerotic calcification of the internal carotid arteries at the skull base. No abnormal hyperdensity of the major intracranial arteries or dural venous sinuses. Skull: The visualized skull base, calvarium and extracranial soft tissues are normal. Sinuses/Orbits: No fluid levels or  advanced mucosal thickening of the visualized paranasal sinuses. No mastoid or middle ear effusion. The orbits are normal. CT CERVICAL SPINE FINDINGS Alignment: No static subluxation. Facets are aligned. Occipital condyles are normally positioned. Skull base and vertebrae: No acute fracture. Soft tissues and spinal canal: No prevertebral fluid or swelling. No visible canal hematoma. Disc levels: No advanced spinal canal or neural foraminal stenosis. Upper chest: No pneumothorax, pulmonary nodule or pleural effusion. Other: Normal visualized paraspinal cervical soft tissues. IMPRESSION: 1. No acute intracranial abnormality. 2. No fracture or static subluxation of the cervical spine. 3. Chronic ischemic microangiopathy and generalized volume loss. Electronically Signed   By: Deatra Robinson M.D.   On: 07/21/2019 03:48   DG Chest Portable 1 View  Result Date: 07/21/2019 CLINICAL DATA:  Right femoral neck fracture EXAM: PORTABLE CHEST 1 VIEW COMPARISON:  Radiograph 07/28/2014 FINDINGS: There is diffuse hazy interstitial opacity with a perihilar predominance as well as cephalized, indistinct pulmonary vascularity and slight obscuration of the costophrenic sulci. Moderate cardiomegaly is increased from comparison exam. The aorta is calcified. The remaining cardiomediastinal contours are unremarkable. No acute osseous or soft tissue abnormality. Degenerative changes are present in the imaged spine and shoulders. IMPRESSION: Features suggesting CHF/volume overload with edema and cardiomegaly. No acute traumatic findings in the chest. Electronically Signed   By: Kreg Shropshire M.D.   On: 07/21/2019 03:18   DG Shoulder Right Portable  Result Date: 07/21/2019 CLINICAL DATA:  Fall EXAM: PORTABLE RIGHT SHOULDER COMPARISON:  None. FINDINGS: Mild soft tissue thickening of the right acromioclavicular joint. There is no evidence of fracture or dislocation. Moderate glenohumeral and acromioclavicular arthrosis. No worrisome  osseous lesions. Included portions of the right chest wall are free of acute abnormality. IMPRESSION: Soft tissue thickening at the right acromioclavicular joint may be degenerative in nature however should correlate for point tenderness to exclude a [place] type I acromioclavicular injury. No acute fracture or osseous malalignment is seen. Moderate glenohumeral and acromioclavicular arthrosis. Electronically Signed   By: Kreg Shropshire M.D.   On: 07/21/2019 03:13   DG HIP UNILAT WITH PELVIS 2-3 VIEWS RIGHT  Result Date: 07/21/2019 CLINICAL DATA:  Fall EXAM: DG HIP (WITH OR WITHOUT PELVIS) 2-3V RIGHT COMPARISON:  None. FINDINGS: There is a transcervical right femoral neck fracture with varus angulation and external rotation across the fracture line. No other acute fracture is seen of the bony pelvis or proximal left femur. Femoral heads remain normally located. Normal bowel gas pattern. Extensive vascular calcium in the pelvis. Soft tissue swelling of the right hip is noted. IMPRESSION: Transcervical right femoral neck fracture with varus angulation and external rotation across the fracture line. Absence Electronically Signed   By: Kreg Shropshire M.D.   On: 07/21/2019 03:11    EKG: Independently reviewed.  A. fib with RVR.  Assessment/Plan Principal Problem:   Closed displaced fracture of right femoral neck (HCC) Active Problems:   Acute CHF (congestive heart failure) (HCC)   New onset atrial fibrillation (HCC)   Hip fracture (HCC)    1. Right hip fracture status post mechanical fall -we will keep Nicholas Trujillo in pain medications and will consult cardiology given the acute CHF and A. fib for clearance.  We will keep Nicholas Trujillo n.p.o.  Orthopedics has been consulted. 2. Acute CHF -Lasix 40 mg IV was given in the ER.  Will closely monitor respiratory status.  Follow intake output Daily weights 2D echo.  Check cardiac markers. 3. New onset A. fib with RVR heart rate improved without any intervention.  Closely  monitor in telemetry.  Presently in anticipation of surgery no anticoagulation has been placed.  Check TSH cardiac markers 2D echo. 4. History of CAD status post stenting has not complained of any chest pain check cardiac markers. 5. Dementia on Namenda.  Presently n.p.o.  Also takes Depakote which we will check levels. 6. Leukocytosis could be reactionary but recently was placed on antibiotics for pneumonia.  Check procalcitonin levels.  If negative may discontinue antibiotics. 7. Fungal infection of the groin and also possible sacral decubitus stage I.  We will keep on nystatin powder. 8. History of diabetes mellitus presently not on medication follow CBGs and check hemoglobin A1c with next blood draw.  Given the hip fracture with new onset CHF A. fib will need close monitoring for any deterioration in inpatient status.   DVT prophylaxis: SCDs in anticipation of possible surgery. Code Status: DNR confirmed with Nicholas Trujillo's son. Family Communication: Nicholas Trujillo's son is also the healthcare power of attorney. Disposition Plan: Rehab when stable. Consults called: Orthopedics and cardiology. Admission status: Inpatient.   Eduard Clos MD [facility] Pager [phone].  If 7PM-7AM, please contact night-coverage [email or web address] Password [facility]  07/21/2019, 6:11 AM

## 2019-07-21 NOTE — Consult Note (Signed)
ORTHOPAEDIC CONSULTATION  REQUESTING PHYSICIAN: Sharlene Dory,*  Chief Complaint: Right femoral neck fracture  HPI: Nicholas Overlie Sr. is a 86 y.o. male who presents with right hip fracture.  Exact circumstances are unclear.  He has advanced dementia, DM, CAD.  Found to be in afib with RVR in ER.  Lives permanently in nursing home.  Walks with walker.  Endorses severe pain with movement of the right leg/hip.  Past Medical History:  Diagnosis Date  . Alcohol abuse   . Alzheimer's dementia, late onset (HCC)   . Hyperlipemia   . Hypertension   . Type 2 diabetes mellitus (HCC)    Past Surgical History:  Procedure Laterality Date  . CHOLECYSTECTOMY    . CORONARY ANGIOPLASTY    . PROSTATE SURGERY     Social History   Socioeconomic History  . Marital status: Married    Spouse name: Not on file  . Number of children: Not on file  . Years of education: Not on file  . Highest education level: Not on file  Occupational History  . Not on file  Tobacco Use  . Smoking status: Former Games developer  . Smokeless tobacco: Never Used  Substance and Sexual Activity  . Alcohol use: Not Currently  . Drug use: Not on file  . Sexual activity: Not on file  Other Topics Concern  . Not on file  Social History Narrative  . Not on file   Social Determinants of Health   Financial Resource Strain:   . Difficulty of Paying Living Expenses: Not on file  Food Insecurity:   . Worried About Programme researcher, broadcasting/film/video in the Last Year: Not on file  . Ran Out of Food in the Last Year: Not on file  Transportation Needs:   . Lack of Transportation (Medical): Not on file  . Lack of Transportation (Non-Medical): Not on file  Physical Activity:   . Days of Exercise per Week: Not on file  . Minutes of Exercise per Session: Not on file  Stress:   . Feeling of Stress : Not on file  Social Connections:   . Frequency of Communication with Friends and Family: Not on file  . Frequency of Social  Gatherings with Friends and Family: Not on file  . Attends Religious Services: Not on file  . Active Member of Clubs or Organizations: Not on file  . Attends Banker Meetings: Not on file  . Marital Status: Not on file   Family History  Problem Relation Age of Onset  . Cancer Mother    Allergies  Allergen Reactions  . Atorvastatin Other (See Comments)    Myalgia Other reaction(s): Myalgias (intolerance)   . Codeine Nausea And Vomiting   Prior to Admission medications   Medication Sig Start Date End Date Taking? Authorizing Provider  acetaminophen (TYLENOL) 500 MG tablet Take 1,000 mg by mouth 3 (three) times daily.   Yes [provider]  divalproex (DEPAKOTE SPRINKLE) 125 MG capsule Take 250 mg by mouth at bedtime.   Yes [provider]  escitalopram (LEXAPRO) 10 MG tablet Take 10 mg by mouth at bedtime.   Yes [provider]  ipratropium-albuterol (DUONEB) 0.5-2.5 (3) MG/3ML SOLN Take 3 mLs by nebulization every 8 (eight) hours.   Yes [provider]  Lidocaine-Menthol [facility] PAIN RELIEF) 3.99-1.25 % PTCH Apply 1 patch topically at bedtime. To low back.  On 12 hours and off 12 hours   Yes [provider]  LORazepam (  ATIVAN) 0.5 MG tablet Take 0.5 mg by mouth at bedtime.   Yes [provider]  memantine (NAMENDA) 5 MG tablet Take 5 mg by mouth 2 (two) times daily.   Yes [provider]  traMADol (ULTRAM) 50 MG tablet Take 50 mg by mouth every 4 (four) hours as needed for moderate pain.   Yes [provider]   CT Head Wo Contrast  Result Date: 07/21/2019 CLINICAL DATA:  Fall EXAM: CT HEAD WITHOUT CONTRAST CT CERVICAL SPINE WITHOUT CONTRAST TECHNIQUE: Multidetector CT imaging of the head and cervical spine was performed following the standard protocol without intravenous contrast. Multiplanar CT image reconstructions of the cervical spine were also generated. COMPARISON:  None. FINDINGS: CT HEAD  FINDINGS Brain: There is no mass, hemorrhage or extra-axial collection. There is generalized atrophy without lobar predilection. There is hypoattenuation of the periventricular white matter, most commonly indicating chronic ischemic microangiopathy. Vascular: Atherosclerotic calcification of the internal carotid arteries at the skull base. No abnormal hyperdensity of the major intracranial arteries or dural venous sinuses. Skull: The visualized skull base, calvarium and extracranial soft tissues are normal. Sinuses/Orbits: No fluid levels or advanced mucosal thickening of the visualized paranasal sinuses. No mastoid or middle ear effusion. The orbits are normal. CT CERVICAL SPINE FINDINGS Alignment: No static subluxation. Facets are aligned. Occipital condyles are normally positioned. Skull base and vertebrae: No acute fracture. Soft tissues and spinal canal: No prevertebral fluid or swelling. No visible canal hematoma. Disc levels: No advanced spinal canal or neural foraminal stenosis. Upper chest: No pneumothorax, pulmonary nodule or pleural effusion. Other: Normal visualized paraspinal cervical soft tissues. IMPRESSION: 1. No acute intracranial abnormality. 2. No fracture or static subluxation of the cervical spine. 3. Chronic ischemic microangiopathy and generalized volume loss. Electronically Signed   By: Deatra Robinson M.D.   On: 07/21/2019 03:48   CT Cervical Spine Wo Contrast  Result Date: 07/21/2019 CLINICAL DATA:  Fall EXAM: CT HEAD WITHOUT CONTRAST CT CERVICAL SPINE WITHOUT CONTRAST TECHNIQUE: Multidetector CT imaging of the head and cervical spine was performed following the standard protocol without intravenous contrast. Multiplanar CT image reconstructions of the cervical spine were also generated. COMPARISON:  None. FINDINGS: CT HEAD FINDINGS Brain: There is no mass, hemorrhage or extra-axial collection. There is generalized atrophy without lobar predilection. There is hypoattenuation of the  periventricular white matter, most commonly indicating chronic ischemic microangiopathy. Vascular: Atherosclerotic calcification of the internal carotid arteries at the skull base. No abnormal hyperdensity of the major intracranial arteries or dural venous sinuses. Skull: The visualized skull base, calvarium and extracranial soft tissues are normal. Sinuses/Orbits: No fluid levels or advanced mucosal thickening of the visualized paranasal sinuses. No mastoid or middle ear effusion. The orbits are normal. CT CERVICAL SPINE FINDINGS Alignment: No static subluxation. Facets are aligned. Occipital condyles are normally positioned. Skull base and vertebrae: No acute fracture. Soft tissues and spinal canal: No prevertebral fluid or swelling. No visible canal hematoma. Disc levels: No advanced spinal canal or neural foraminal stenosis. Upper chest: No pneumothorax, pulmonary nodule or pleural effusion. Other: Normal visualized paraspinal cervical soft tissues. IMPRESSION: 1. No acute intracranial abnormality. 2. No fracture or static subluxation of the cervical spine. 3. Chronic ischemic microangiopathy and generalized volume loss. Electronically Signed   By: Deatra Robinson M.D.   On: 07/21/2019 03:48   DG Chest Portable 1 View  Result Date: 07/21/2019 CLINICAL DATA:  Right femoral neck fracture EXAM: PORTABLE CHEST 1 VIEW COMPARISON:  Radiograph 07/28/2014 FINDINGS: There is diffuse  hazy interstitial opacity with a perihilar predominance as well as cephalized, indistinct pulmonary vascularity and slight obscuration of the costophrenic sulci. Moderate cardiomegaly is increased from comparison exam. The aorta is calcified. The remaining cardiomediastinal contours are unremarkable. No acute osseous or soft tissue abnormality. Degenerative changes are present in the imaged spine and shoulders. IMPRESSION: Features suggesting CHF/volume overload with edema and cardiomegaly. No acute traumatic findings in the chest.  Electronically Signed   By: Lovena Le M.D.   On: 07/21/2019 03:18   DG Shoulder Right Portable  Result Date: 07/21/2019 CLINICAL DATA:  Fall EXAM: PORTABLE RIGHT SHOULDER COMPARISON:  None. FINDINGS: Mild soft tissue thickening of the right acromioclavicular joint. There is no evidence of fracture or dislocation. Moderate glenohumeral and acromioclavicular arthrosis. No worrisome osseous lesions. Included portions of the right chest wall are free of acute abnormality. IMPRESSION: Soft tissue thickening at the right acromioclavicular joint may be degenerative in nature however should correlate for point tenderness to exclude a [place] type I acromioclavicular injury. No acute fracture or osseous malalignment is seen. Moderate glenohumeral and acromioclavicular arthrosis. Electronically Signed   By: Lovena Le M.D.   On: 07/21/2019 03:13   DG Knee Right Port  Result Date: 07/21/2019 CLINICAL DATA:  Status post fall, pain. EXAM: PORTABLE RIGHT KNEE - 1-2 VIEW COMPARISON:  None. FINDINGS: Generalized osteopenia. No fracture or dislocation. No aggressive osseous lesion. Small joint effusion. Peripheral vascular atherosclerotic disease. Mild tricompartmental osteoarthritis of the right knee. IMPRESSION: No acute osseous injury of the right knee. Electronically Signed   By: Kathreen Devoid   On: 07/21/2019 08:52   DG HIP UNILAT WITH PELVIS 2-3 VIEWS RIGHT  Result Date: 07/21/2019 CLINICAL DATA:  Fall EXAM: DG HIP (WITH OR WITHOUT PELVIS) 2-3V RIGHT COMPARISON:  None. FINDINGS: There is a transcervical right femoral neck fracture with varus angulation and external rotation across the fracture line. No other acute fracture is seen of the bony pelvis or proximal left femur. Femoral heads remain normally located. Normal bowel gas pattern. Extensive vascular calcium in the pelvis. Soft tissue swelling of the right hip is noted. IMPRESSION: Transcervical right femoral neck fracture with varus angulation and  external rotation across the fracture line. Absence Electronically Signed   By: Lovena Le M.D.   On: 07/21/2019 03:11    All pertinent xrays, MRI, CT independently reviewed and interpreted  Positive ROS: All other systems have been reviewed and were otherwise negative with the exception of those mentioned in the HPI and as above.  Physical Exam: General: Alert, no acute distress Cardiovascular: No pedal edema Respiratory: No cyanosis, no use of accessory musculature GI: No organomegaly, abdomen is soft and non-tender Skin: No lesions in the area of chief complaint Neurologic: Sensation intact distally Psychiatric: advanced dementia Lymphatic: No axillary or cervical lymphadenopathy  MUSCULOSKELETAL:  - severe pain with movement of the hip and extremity - skin intact - NVI distally - compartments soft  Assessment: Right femoral neck fracture  Plan: - surgical treatment is recommended for pain relief, quality of life, mobilization with PT - patient and family are aware of r/b/a and wish to proceed, informed consent obtained - medical and cardiac optimization today - surgery is planned for Thursday afternoon - may give 1 dose of lovenox today  Thank you for the consult and the opportunity to see Mr. Hien Perreira. Eduard Roux, MD [facility] 9:10 AM

## 2019-07-21 NOTE — ED Notes (Signed)
In and out cath performed using sterile technique with urine obtained and sent to lab.

## 2019-07-21 NOTE — Progress Notes (Signed)
  PROGRESS NOTE  Patient admitted earlier this morning. See H&P.   86 year old male with a past medical history of alcohol abuse, diabetes, CAD status post stenting. pt in bed w mittens on. Agitated. Poor historian. Report states he had an unwitnessed fall at a nursing facility.  Orthopedic surgery to take him to the OR tomorrow should cardiology clear him today.  Sharlene Dory, DO [facility] 07/21/2019, 9:48 AM  Available via Epic secure chat 7am-7pm After these hours, please refer to coverage provider listed on [email or web address]

## 2019-07-21 NOTE — Consult Note (Addendum)
Cardiology Consultation:   Patient ID: Nicholas Overlie Sr. MRN: [id number]; DOB: 03/28/1929  Admit date: 07/21/2019 Date of Consult: 07/21/2019  Primary Care Provider: Patient, No Pcp Per Primary Cardiologist: No primary care provider on file.  Primary Electrophysiologist:  None   Patient Profile:   Nicholas Overlie Sr. is a 86 y.o. male with a hx of HTN, HL, DM, ETOH use and dementia who is being seen today for the evaluation of preoperative evaluation at the request of Dr. Carmelia Roller.  History of Present Illness:   Nicholas Trujillo is an 86 yo male with PMH noted above. Discussed with the son over the phone as the patient has advanced dementia. Son reports he did have a cardiac stent placed back about 25 yrs ago while he was living in [place] and followed with a cardiologist for many years. Says about 10 years ago his health started to decline and they decided to stop all of his home medications as they felt these were not helping him. He has been living at home and son helps with care until about 3-4 months ago when he was admitted to [facility] with a compression fracture. From there he was transferred to [facility] assisted living and has been there since.   Son reports he receives great care there and is happy with the facility. Reports he has been informed of 4 different occasions when his father has fallen. Last evening he was brought into the ED after an unwitnessed fall from his bed.   In the ED his labs showed K+ 5.4, WBC 17.2,  BNP 655, hsTn 29. CXR showed CHF with edema, right hip xray showed transcervical femoral neck fracture. He was also found to be in new onset Afib RVR. He was given IV lasix in the ED and admitted. Seen by orthoMD with plans for surgery Thursday afternoon.   Heart Pathway Score:     Past Medical History:  Diagnosis Date   Alcohol abuse    Alzheimer's dementia, late onset (HCC)    Hyperlipemia    Hypertension    Type 2 diabetes mellitus (HCC)     Past  Surgical History:  Procedure Laterality Date   CHOLECYSTECTOMY     CORONARY ANGIOPLASTY     PROSTATE SURGERY       Home Medications:  Prior to Admission medications   Medication Sig Start Date End Date Taking? Authorizing Provider  acetaminophen (TYLENOL) 500 MG tablet Take 1,000 mg by mouth 3 (three) times daily.   Yes [provider]  divalproex (DEPAKOTE SPRINKLE) 125 MG capsule Take 250 mg by mouth at bedtime.   Yes [provider]  escitalopram (LEXAPRO) 10 MG tablet Take 10 mg by mouth at bedtime.   Yes [provider]  ipratropium-albuterol (DUONEB) 0.5-2.5 (3) MG/3ML SOLN Take 3 mLs by nebulization every 8 (eight) hours.   Yes [provider]  Lidocaine-Menthol [facility] PAIN RELIEF) 3.99-1.25 % PTCH Apply 1 patch topically at bedtime. To low back.  On 12 hours and off 12 hours   Yes [provider]  LORazepam (ATIVAN) 0.5 MG tablet Take 0.5 mg by mouth at bedtime.   Yes [provider]  memantine (NAMENDA) 5 MG tablet Take 5 mg by mouth 2 (two) times daily.   Yes [provider]  traMADol (ULTRAM) 50 MG tablet Take 50 mg by mouth every 4 (four) hours as needed for moderate pain.   Yes [provider]    Inpatient Medications: Scheduled Meds:  Continuous Infusions:   PRN Meds: morphine injection  Allergies:    Allergies  Allergen Reactions   Atorvastatin Other (See Comments)    Myalgia Other reaction(s): Myalgias (intolerance)    Codeine Nausea And Vomiting    Social History:   Social History   Socioeconomic History   Marital status: Married    Spouse name: Not on file   Number of children: Not on file   Years of education: Not on file   Highest education level: Not on file  Occupational History   Not on file  Tobacco Use   Smoking status: Former Smoker   Smokeless tobacco: Never Used  Substance and Sexual Activity   Alcohol use: Not Currently   Drug use: Not on file   Sexual  activity: Not on file  Other Topics Concern   Not on file  Social History Narrative   Not on file   Social Determinants of Health   Financial Resource Strain:    Difficulty of Paying Living Expenses: Not on file  Food Insecurity:    Worried About Running Out of Food in the Last Year: Not on file   [employer] of Food in the Last Year: Not on file  Transportation Needs:    Lack of Transportation (Medical): Not on file   Lack of Transportation (Non-Medical): Not on file  Physical Activity:    Days of Exercise per Week: Not on file   Minutes of Exercise per Session: Not on file  Stress:    Feeling of Stress : Not on file  Social Connections:    Frequency of Communication with Friends and Family: Not on file   Frequency of Social Gatherings with Friends and Family: Not on file   Attends Religious Services: Not on file   Active Member of Clubs or Organizations: Not on file   Attends Banker Meetings: Not on file   Marital Status: Not on file  Intimate Partner Violence:    Fear of Current or Ex-Partner: Not on file   Emotionally Abused: Not on file   Physically Abused: Not on file   Sexually Abused: Not on file    Family History:    Family History  Problem Relation Age of Onset   Cancer Mother      ROS:  Please see the history of present illness.   All other ROS reviewed and negative.     Physical Exam/Data:   Vitals:   07/21/19 0330 07/21/19 0749 07/21/19 0806 07/21/19 0900  BP: (!) 151/89 (!) 151/98    Pulse: (!) 50     Resp:   (!) 21   Temp:    98.9 F (37.2 C)  TempSrc:    Axillary  SpO2: 92%       Intake/Output Summary (Last 24 hours) at 07/21/2019 0936 Last data filed at 07/21/2019 0830 Gross per 24 hour  Intake --  Output 150 ml  Net -150 ml   No flowsheet data found.   There is no height or weight on file to calculate BMI.  General:  Well nourished, well developed, older WM, laying in bed, in no acute distress. Mitten restraints on. HEENT:  normal Lymph: no adenopathy Neck: no JVD Vascular: No carotid bruits; FA pulses 2+ bilaterally without bruits  Cardiac:  normal S1, S2; Irreg Irreg tachy; no murmur  Lungs:  clear to auscultation bilaterally, no wheezing, rhonchi or rales  Abd: soft, nontender, no hepatomegaly  Ext: 1+ bilateral LE edema Musculoskeletal:  No deformities,  BUE and BLE strength normal and equal Skin: warm and dry  Neuro: No focal abnormalities noted Psych:  Confused at baseline  EKG:  The EKG was personally reviewed and demonstrates:  Afib RVR, LVH, PVC Telemetry:  Telemetry was personally reviewed and demonstrates:  Afib RVR  Relevant CV Studies:  N/a   Laboratory Data:  High Sensitivity Troponin:   Recent Labs  Lab 07/21/19 0239  TROPONINIHS 29*     Chemistry Recent Labs  Lab 07/21/19 0239  NA 135  K 5.4*  CL 98  CO2 26  GLUCOSE 126*  BUN 22  CREATININE 0.81  CALCIUM 8.9  GFRNONAA >60  GFRAA >60  ANIONGAP 11    No results for input(s): PROT, ALBUMIN, AST, ALT, ALKPHOS, BILITOT in the last 168 hours. Hematology Recent Labs  Lab 07/21/19 0239 07/21/19 0609  WBC 17.2* 18.6*  RBC 4.50 4.47  HGB 14.7 14.6  HCT 43.4 45.1  MCV 96.4 100.9*  MCH 32.7 32.7  MCHC 33.9 32.4  RDW 14.5 14.9  PLT 200 209   BNP Recent Labs  Lab 07/21/19 0239  BNP 655.7*    DDimer No results for input(s): DDIMER in the last 168 hours.   Radiology/Studies:  CT Head Wo Contrast  Result Date: 07/21/2019 CLINICAL DATA:  Fall EXAM: CT HEAD WITHOUT CONTRAST CT CERVICAL SPINE WITHOUT CONTRAST TECHNIQUE: Multidetector CT imaging of the head and cervical spine was performed following the standard protocol without intravenous contrast. Multiplanar CT image reconstructions of the cervical spine were also generated. COMPARISON:  None. FINDINGS: CT HEAD FINDINGS Brain: There is no mass, hemorrhage or extra-axial collection. There is generalized atrophy without lobar predilection. There is hypoattenuation of  the periventricular white matter, most commonly indicating chronic ischemic microangiopathy. Vascular: Atherosclerotic calcification of the internal carotid arteries at the skull base. No abnormal hyperdensity of the major intracranial arteries or dural venous sinuses. Skull: The visualized skull base, calvarium and extracranial soft tissues are normal. Sinuses/Orbits: No fluid levels or advanced mucosal thickening of the visualized paranasal sinuses. No mastoid or middle ear effusion. The orbits are normal. CT CERVICAL SPINE FINDINGS Alignment: No static subluxation. Facets are aligned. Occipital condyles are normally positioned. Skull base and vertebrae: No acute fracture. Soft tissues and spinal canal: No prevertebral fluid or swelling. No visible canal hematoma. Disc levels: No advanced spinal canal or neural foraminal stenosis. Upper chest: No pneumothorax, pulmonary nodule or pleural effusion. Other: Normal visualized paraspinal cervical soft tissues. IMPRESSION: 1. No acute intracranial abnormality. 2. No fracture or static subluxation of the cervical spine. 3. Chronic ischemic microangiopathy and generalized volume loss. Electronically Signed   By: Deatra Robinson M.D.   On: 07/21/2019 03:48   CT Cervical Spine Wo Contrast  Result Date: 07/21/2019 CLINICAL DATA:  Fall EXAM: CT HEAD WITHOUT CONTRAST CT CERVICAL SPINE WITHOUT CONTRAST TECHNIQUE: Multidetector CT imaging of the head and cervical spine was performed following the standard protocol without intravenous contrast. Multiplanar CT image reconstructions of the cervical spine were also generated. COMPARISON:  None. FINDINGS: CT HEAD FINDINGS Brain: There is no mass, hemorrhage or extra-axial collection. There is generalized atrophy without lobar predilection. There is hypoattenuation of the periventricular white matter, most commonly indicating chronic ischemic microangiopathy. Vascular: Atherosclerotic calcification of the internal carotid arteries  at the skull base. No abnormal hyperdensity of the major intracranial arteries or dural venous sinuses. Skull: The visualized skull base, calvarium and extracranial soft tissues are normal. Sinuses/Orbits: No fluid levels or advanced mucosal thickening of the visualized  paranasal sinuses. No mastoid or middle ear effusion. The orbits are normal. CT CERVICAL SPINE FINDINGS Alignment: No static subluxation. Facets are aligned. Occipital condyles are normally positioned. Skull base and vertebrae: No acute fracture. Soft tissues and spinal canal: No prevertebral fluid or swelling. No visible canal hematoma. Disc levels: No advanced spinal canal or neural foraminal stenosis. Upper chest: No pneumothorax, pulmonary nodule or pleural effusion. Other: Normal visualized paraspinal cervical soft tissues. IMPRESSION: 1. No acute intracranial abnormality. 2. No fracture or static subluxation of the cervical spine. 3. Chronic ischemic microangiopathy and generalized volume loss. Electronically Signed   By: Ulyses Jarred M.D.   On: 07/21/2019 03:48   DG Chest Portable 1 View  Result Date: 07/21/2019 CLINICAL DATA:  Right femoral neck fracture EXAM: PORTABLE CHEST 1 VIEW COMPARISON:  Radiograph 07/28/2014 FINDINGS: There is diffuse hazy interstitial opacity with a perihilar predominance as well as cephalized, indistinct pulmonary vascularity and slight obscuration of the costophrenic sulci. Moderate cardiomegaly is increased from comparison exam. The aorta is calcified. The remaining cardiomediastinal contours are unremarkable. No acute osseous or soft tissue abnormality. Degenerative changes are present in the imaged spine and shoulders. IMPRESSION: Features suggesting CHF/volume overload with edema and cardiomegaly. No acute traumatic findings in the chest. Electronically Signed   By: Lovena Le M.D.   On: 07/21/2019 03:18   DG Shoulder Right Portable  Result Date: 07/21/2019 CLINICAL DATA:  Fall EXAM: PORTABLE RIGHT  SHOULDER COMPARISON:  None. FINDINGS: Mild soft tissue thickening of the right acromioclavicular joint. There is no evidence of fracture or dislocation. Moderate glenohumeral and acromioclavicular arthrosis. No worrisome osseous lesions. Included portions of the right chest wall are free of acute abnormality. IMPRESSION: Soft tissue thickening at the right acromioclavicular joint may be degenerative in nature however should correlate for point tenderness to exclude a [place] type I acromioclavicular injury. No acute fracture or osseous malalignment is seen. Moderate glenohumeral and acromioclavicular arthrosis. Electronically Signed   By: Lovena Le M.D.   On: 07/21/2019 03:13   DG Knee Right Port  Result Date: 07/21/2019 CLINICAL DATA:  Status post fall, pain. EXAM: PORTABLE RIGHT KNEE - 1-2 VIEW COMPARISON:  None. FINDINGS: Generalized osteopenia. No fracture or dislocation. No aggressive osseous lesion. Small joint effusion. Peripheral vascular atherosclerotic disease. Mild tricompartmental osteoarthritis of the right knee. IMPRESSION: No acute osseous injury of the right knee. Electronically Signed   By: Kathreen Devoid   On: 07/21/2019 08:52   DG HIP UNILAT WITH PELVIS 2-3 VIEWS RIGHT  Result Date: 07/21/2019 CLINICAL DATA:  Fall EXAM: DG HIP (WITH OR WITHOUT PELVIS) 2-3V RIGHT COMPARISON:  None. FINDINGS: There is a transcervical right femoral neck fracture with varus angulation and external rotation across the fracture line. No other acute fracture is seen of the bony pelvis or proximal left femur. Femoral heads remain normally located. Normal bowel gas pattern. Extensive vascular calcium in the pelvis. Soft tissue swelling of the right hip is noted. IMPRESSION: Transcervical right femoral neck fracture with varus angulation and external rotation across the fracture line. Absence Electronically Signed   By: Lovena Le M.D.   On: 07/21/2019 03:11    Assessment and Plan:   Nicholas Moeller Sr. is a  86 y.o. male with a hx of HTN, HL, DM, ETOH use and dementia who is being seen today for the evaluation of preoperative evaluation at the request of Dr. Nani Ravens.  1. Preoperative Evaluation for right hip fracture: presented after an unwitnessed fall at SNF. Now with  right hip fracture. In talking with his son he has been off all medications for about 10 years. Does report a hx of cardiac stent about 25 years ago. No chest pain prior to admission, did have some shortness of breath noted but recently Dx with PNA and being treated for the same. Given his advanced age he is at increased risk. Echo ordered and pending. Do not anticipate further cardiac work up at this time.   2. New onset afib RVR: noted on admission to the ED. Rates are elevated, but not on any rate controlling meds at present -- start metoprolol 25mg  BID -- This patients CHA2DS2-VASc Score of at least 4. No anticoagulation at this time as he is pending surgery. Given his advanced dementia and freq falls would not consider him a long term candidate for OAC.   3. Recent PNA: son reported he was placed on antibiotics several days prior to admission. WBC elevated on admission. Management per primary  4. Acute HF: etiology unknown. Edema and cardiomegaly noted on CXR. Given IV lasix on admission. Little UOP noted but he is incontinent as well. Comfortable laying flat on exam.   -- echo pending  For questions or updates, please contact [facility] Please consult [email or web address] for contact info under     Signed, , NP  07/21/2019 9:36 AM   Agree with note by 09/18/2019 NP-C  Patient admitted for hip fracture.  History of hypertension in the past not on medications.  He lives in a skilled nursing facility.  Does have advanced dementia and is a DNR.  He has A. fib with RVR.  We will get a 2D echo for LV function and rate control him on oral beta-blockers.  He is combative on exam and not communicative.  His lungs  are clear.  Heart is rapid and irregular consistent with A. fib.  He is not a candidate for an oral anticoagulant given his mental status and fall risk.  Otherwise cleared for his surgery from a cardiology point of view.  No further work-up is required.  Laverda Page, M.D., FACP, [facility], [facility] [address] [place], [place]  [place]  [phone] 07/21/2019 11:38 AM

## 2019-07-21 NOTE — TOC Initial Note (Signed)
Transition of Care (TOC) - Initial/Assessment Note    Patient Details  Name: Nicholas GINGER Sr. MRN: [id number] Date of Birth: [birth year]  Transition of Care [facility]) CM/SW Contact:    Nonda Lou, LCSW Phone Number: 07/21/2019, 4:54 PM  Clinical Narrative:                 CSW received consult for possible SNF placement at time of discharge. CSW spoke with patient's son Nicholas Trujillo [phone] regarding PT recommendation of SNF placement at time of discharge. Patient's son stated the plan will be to return to [facility]. CSW informed patient's son the facility will be contacted prior to discharge to coordinate plans and PT that may be needed at the facility. Patient's son expressed he is the point of contact for patient's medical decisions. No further questions reported at this time. CSW to continue to follow and assist with discharge planning needs.   Expected Discharge Plan: Assisted Living Barriers to Discharge: Continued Medical Work up   Patient Goals and CMS Choice   CMS Medicare.gov Compare Post Acute Care list provided to:: Patient Represenative (must comment)(James, son) Choice offered to / list presented to : Adult Children  Expected Discharge Plan and Services Expected Discharge Plan: Assisted Living       Living arrangements for the past 2 months: Assisted Living Facility([facility])                                      Prior Living Arrangements/Services Living arrangements for the past 2 months: Assisted Living Facility([facility]) Lives with:: Facility Resident Patient language and need for interpreter reviewed:: Yes Do you feel safe going back to the place where you live?: Yes      Need for Family Participation in Patient Care: Yes (Comment) Care giver support system in place?: Yes (comment)   Criminal Activity/Legal Involvement Pertinent to Current Situation/Hospitalization: No - Comment as needed  Activities of Daily Living      Permission  Sought/Granted Permission sought to share information with : Family Supports, Facility Contact Representative(James, son)    Share Information with NAME: Nicholas Trujillo     Permission granted to share info w Relationship: Son  Permission granted to share info w Contact Information: [phone]  Emotional Assessment   Attitude/Demeanor/Rapport: Unable to Assess Affect (typically observed): Unable to Assess   Alcohol / Substance Use: Not Applicable Psych Involvement: No (comment)  Admission diagnosis:  Fracture [T14.8XXA] Hip fracture (HCC) [S72.009A] Acute pulmonary edema (HCC) [J81.0] Pain [R52] Closed right hip fracture, initial encounter (HCC) [S72.001A] Closed displaced fracture of right femoral neck (HCC) [S72.001A] Patient Active Problem List   Diagnosis Date Noted  . Closed displaced fracture of right femoral neck (HCC) 07/21/2019  . Acute CHF (congestive heart failure) (HCC) 07/21/2019  . New onset atrial fibrillation (HCC) 07/21/2019  . Hip fracture (HCC) 07/21/2019  . SIRS (systemic inflammatory response syndrome) (HCC) 07/21/2019   PCP:  Patient, No Pcp Per Pharmacy:  No Pharmacies Listed    Social Determinants of Health (SDOH) Interventions    Readmission Risk Interventions No flowsheet data found.

## 2019-07-22 ENCOUNTER — Encounter (HOSPITAL_COMMUNITY): Payer: Self-pay | Admitting: Internal Medicine

## 2019-07-22 ENCOUNTER — Inpatient Hospital Stay (HOSPITAL_COMMUNITY): Payer: Medicare Other

## 2019-07-22 ENCOUNTER — Encounter (HOSPITAL_COMMUNITY)
Admission: EM | Disposition: A | Discharge status: Skilled Nursing Facility | Payer: Self-pay | Source: Skilled Nursing Facility | Attending: Student

## 2019-07-22 ENCOUNTER — Inpatient Hospital Stay (HOSPITAL_COMMUNITY): Payer: Medicare Other | Admitting: Certified Registered"

## 2019-07-22 DIAGNOSIS — E1165 Type 2 diabetes mellitus with hyperglycemia: Secondary | ICD-10-CM | POA: Diagnosis present

## 2019-07-22 DIAGNOSIS — I10 Essential (primary) hypertension: Secondary | ICD-10-CM

## 2019-07-22 DIAGNOSIS — E785 Hyperlipidemia, unspecified: Secondary | ICD-10-CM | POA: Diagnosis present

## 2019-07-22 DIAGNOSIS — R339 Retention of urine, unspecified: Secondary | ICD-10-CM | POA: Diagnosis not present

## 2019-07-22 DIAGNOSIS — I517 Cardiomegaly: Secondary | ICD-10-CM

## 2019-07-22 DIAGNOSIS — J9601 Acute respiratory failure with hypoxia: Secondary | ICD-10-CM | POA: Diagnosis not present

## 2019-07-22 LAB — URINALYSIS, ROUTINE W REFLEX MICROSCOPIC
Bilirubin Urine: NEGATIVE
Glucose, UA: NEGATIVE mg/dL
Ketones, ur: 5 mg/dL — AB
Nitrite: NEGATIVE
Protein, ur: 100 mg/dL — AB
RBC / HPF: >50 RBC/hpf — ABNORMAL HIGH (ref 0–5)
Specific Gravity, Urine: 1.027 (ref 1.005–1.030)
pH: 5 (ref 5.0–8.0)

## 2019-07-22 LAB — CBC
HCT: 42.3 % (ref 39.0–52.0)
HCT: 44.9 % (ref 39.0–52.0)
Hemoglobin: 14.4 g/dL (ref 13.0–17.0)
Hemoglobin: 14.9 g/dL (ref 13.0–17.0)
MCH: 32.4 pg (ref 26.0–34.0)
MCH: 32 pg (ref 26.0–34.0)
MCHC: 34 g/dL (ref 30.0–36.0)
MCHC: 33.2 g/dL (ref 30.0–36.0)
MCV: 95.3 fL (ref 80.0–100.0)
MCV: 96.4 fL (ref 80.0–100.0)
Platelets: 190 10*3/uL (ref 150–400)
Platelets: 182 10*3/uL (ref 150–400)
RBC: 4.44 MIL/uL (ref 4.22–5.81)
RBC: 4.66 MIL/uL (ref 4.22–5.81)
RDW: 14.6 % (ref 11.5–15.5)
RDW: 14.7 % (ref 11.5–15.5)
WBC: 11.6 10*3/uL — ABNORMAL HIGH (ref 4.0–10.5)
WBC: 11.9 10*3/uL — ABNORMAL HIGH (ref 4.0–10.5)
nRBC: 0 % (ref 0.0–0.2)
nRBC: 0 % (ref 0.0–0.2)

## 2019-07-22 LAB — BASIC METABOLIC PANEL
Anion gap: 14 (ref 5–15)
Anion gap: 14 (ref 5–15)
BUN: 28 mg/dL — ABNORMAL HIGH (ref 8–23)
BUN: 34 mg/dL — ABNORMAL HIGH (ref 8–23)
CO2: 24 mmol/L (ref 22–32)
CO2: 27 mmol/L (ref 22–32)
Calcium: 9.1 mg/dL (ref 8.9–10.3)
Calcium: 9.3 mg/dL (ref 8.9–10.3)
Chloride: 100 mmol/L (ref 98–111)
Chloride: 100 mmol/L (ref 98–111)
Creatinine, Ser: 1.13 mg/dL (ref 0.61–1.24)
Creatinine, Ser: 1.16 mg/dL (ref 0.61–1.24)
GFR calc Af Amer: >60 mL/min (ref >60)
GFR calc Af Amer: >60 mL/min (ref >60)
GFR calc non Af Amer: 58 mL/min — ABNORMAL LOW (ref >60)
GFR calc non Af Amer: 56 mL/min — ABNORMAL LOW (ref >60)
Glucose, Bld: 173 mg/dL — ABNORMAL HIGH (ref 70–99)
Glucose, Bld: 149 mg/dL — ABNORMAL HIGH (ref 70–99)
Potassium: 4 mmol/L (ref 3.5–5.1)
Potassium: 4 mmol/L (ref 3.5–5.1)
Sodium: 138 mmol/L (ref 135–145)
Sodium: 141 mmol/L (ref 135–145)

## 2019-07-22 LAB — GLUCOSE, CAPILLARY
Glucose-Capillary: 142 mg/dL — ABNORMAL HIGH (ref 70–99)
Glucose-Capillary: 132 mg/dL — ABNORMAL HIGH (ref 70–99)
Glucose-Capillary: 142 mg/dL — ABNORMAL HIGH (ref 70–99)
Glucose-Capillary: 135 mg/dL — ABNORMAL HIGH (ref 70–99)
Glucose-Capillary: 128 mg/dL — ABNORMAL HIGH (ref 70–99)

## 2019-07-22 LAB — TROPONIN I (HIGH SENSITIVITY)
Troponin I (High Sensitivity): 92 ng/L — ABNORMAL HIGH (ref <18)
Troponin I (High Sensitivity): 88 ng/L — ABNORMAL HIGH (ref <18)

## 2019-07-22 LAB — HEMOGLOBIN A1C
Hgb A1c MFr Bld: 5.7 % — ABNORMAL HIGH (ref 4.8–5.6)
Mean Plasma Glucose: 117 mg/dL

## 2019-07-22 LAB — SURGICAL PCR SCREEN
MRSA, PCR: POSITIVE — AB
Staphylococcus aureus: POSITIVE — AB

## 2019-07-22 LAB — ECHOCARDIOGRAM COMPLETE: Weight: 3232 oz

## 2019-07-22 LAB — MAGNESIUM: Magnesium: 2 mg/dL (ref 1.7–2.4)

## 2019-07-22 SURGERY — HEMIARTHROPLASTY, HIP, DIRECT ANTERIOR APPROACH, FOR FRACTURE
Anesthesia: General

## 2019-07-22 MED ORDER — VANCOMYCIN HCL 1500 MG/300ML IV SOLN
1500.0000 mg | INTRAVENOUS | Status: DC
  Administered 2019-07-22 – 2019-07-24 (×3): 1500 mg via INTRAVENOUS
  Filled 2019-07-22 (×5): qty 300

## 2019-07-22 MED ORDER — TRANEXAMIC ACID-NACL 1000-0.7 MG/100ML-% IV SOLN
INTRAVENOUS | Status: AC
  Filled 2019-07-22: qty 100

## 2019-07-22 MED ORDER — LACTATED RINGERS IV SOLN: INTRAVENOUS | Status: DC

## 2019-07-22 MED ORDER — FUROSEMIDE 10 MG/ML IJ SOLN
40.0000 mg | Freq: Two times a day (BID) | INTRAMUSCULAR | Status: DC
  Administered 2019-07-22 – 2019-07-28 (×11): 40 mg via INTRAVENOUS
  Filled 2019-07-22 (×11): qty 4

## 2019-07-22 MED ORDER — PHENYLEPHRINE 40 MCG/ML (10ML) SYRINGE FOR IV PUSH (FOR BLOOD PRESSURE SUPPORT)
PREFILLED_SYRINGE | INTRAVENOUS | Status: AC
  Filled 2019-07-22: qty 10

## 2019-07-22 MED ORDER — CHLORHEXIDINE GLUCONATE CLOTH 2 % EX PADS
6.0000 | MEDICATED_PAD | Freq: Every day | CUTANEOUS | Status: AC
  Administered 2019-07-22 – 2019-07-25 (×3): 6 via TOPICAL

## 2019-07-22 MED ORDER — LIDOCAINE 2% (20 MG/ML) 5 ML SYRINGE
INTRAMUSCULAR | Status: AC
  Filled 2019-07-22: qty 5

## 2019-07-22 MED ORDER — FENTANYL CITRATE (PF) 250 MCG/5ML IJ SOLN
INTRAMUSCULAR | Status: AC
  Filled 2019-07-22: qty 5

## 2019-07-22 MED ORDER — MUPIROCIN 2 % EX OINT
1.0000 "application " | TOPICAL_OINTMENT | Freq: Two times a day (BID) | CUTANEOUS | Status: DC
  Administered 2019-07-22 – 2019-07-23 (×2): 1 via NASAL
  Filled 2019-07-22: qty 22

## 2019-07-22 MED ORDER — PROPOFOL 10 MG/ML IV BOLUS
INTRAVENOUS | Status: AC
  Filled 2019-07-22: qty 20

## 2019-07-22 MED ORDER — INSULIN ASPART 100 UNIT/ML ~~LOC~~ SOLN
0.0000 [IU] | Freq: Three times a day (TID) | SUBCUTANEOUS | Status: DC
  Administered 2019-07-22 – 2019-07-24 (×4): 1 [IU] via SUBCUTANEOUS
  Administered 2019-07-24: 11:00:00 3 [IU] via SUBCUTANEOUS
  Administered 2019-07-25 (×2): 1 [IU] via SUBCUTANEOUS
  Administered 2019-07-25 – 2019-07-26 (×2): 2 [IU] via SUBCUTANEOUS
  Administered 2019-07-27: 08:00:00 1 [IU] via SUBCUTANEOUS
  Administered 2019-07-27 – 2019-07-28 (×3): 2 [IU] via SUBCUTANEOUS
  Administered 2019-07-28: 08:00:00 1 [IU] via SUBCUTANEOUS
  Administered 2019-07-29: 13:00:00 3 [IU] via SUBCUTANEOUS
  Administered 2019-07-29: 2 [IU] via SUBCUTANEOUS

## 2019-07-22 MED ORDER — METOPROLOL TARTRATE 5 MG/5ML IV SOLN
5.0000 mg | Freq: Four times a day (QID) | INTRAVENOUS | Status: DC
  Administered 2019-07-22 – 2019-07-25 (×13): 5 mg via INTRAVENOUS
  Filled 2019-07-22 (×13): qty 5

## 2019-07-22 MED ORDER — ONDANSETRON HCL 4 MG/2ML IJ SOLN
INTRAMUSCULAR | Status: AC
  Filled 2019-07-22: qty 2

## 2019-07-22 MED ORDER — MUPIROCIN 2 % EX OINT
1.0000 "application " | TOPICAL_OINTMENT | Freq: Two times a day (BID) | CUTANEOUS | Status: AC
  Administered 2019-07-22 – 2019-07-26 (×9): 1 via NASAL
  Filled 2019-07-22 (×2): qty 22

## 2019-07-22 MED ORDER — VANCOMYCIN HCL 1000 MG IV SOLR
INTRAVENOUS | Status: AC
  Filled 2019-07-22: qty 1000

## 2019-07-22 MED ORDER — SUCCINYLCHOLINE CHLORIDE 200 MG/10ML IV SOSY
PREFILLED_SYRINGE | INTRAVENOUS | Status: AC
  Filled 2019-07-22: qty 10

## 2019-07-22 MED ORDER — ROCURONIUM BROMIDE 10 MG/ML (PF) SYRINGE
PREFILLED_SYRINGE | INTRAVENOUS | Status: AC
  Filled 2019-07-22: qty 10

## 2019-07-22 MED ORDER — INSULIN ASPART 100 UNIT/ML ~~LOC~~ SOLN
0.0000 [IU] | Freq: Every day | SUBCUTANEOUS | Status: DC
  Administered 2019-07-27 – 2019-07-29 (×2): 2 [IU] via SUBCUTANEOUS

## 2019-07-22 MED ORDER — SODIUM CHLORIDE 0.9 % IV SOLN
2.0000 g | Freq: Two times a day (BID) | INTRAVENOUS | Status: DC
  Administered 2019-07-22 – 2019-07-26 (×10): 2 g via INTRAVENOUS
  Filled 2019-07-22 (×10): qty 2

## 2019-07-22 MED ORDER — FUROSEMIDE 10 MG/ML IJ SOLN
40.0000 mg | Freq: Once | INTRAMUSCULAR | Status: AC
  Administered 2019-07-22: 16:00:00 40 mg via INTRAVENOUS
  Filled 2019-07-22: qty 4

## 2019-07-22 NOTE — Anesthesia Preprocedure Evaluation (Deleted)
Anesthesia Evaluation  Patient identified by MRN, date of birth, ID band Patient awake    Reviewed: Allergy & Precautions, NPO status , Patient's Chart, lab work & pertinent test results  Airway Mallampati: II  TM Distance: >3 FB     Dental   Pulmonary former smoker,    breath sounds clear to auscultation       Cardiovascular hypertension, +CHF   Rhythm:Regular Rate:Normal     Neuro/Psych    GI/Hepatic negative GI ROS, Neg liver ROS,   Endo/Other  diabetes  Renal/GU negative Renal ROS     Musculoskeletal   Abdominal   Peds  Hematology   Anesthesia Other Findings   Reproductive/Obstetrics                            Anesthesia Physical Anesthesia Plan  ASA: IV  Anesthesia Plan: General   Post-op Pain Management:    Induction: Intravenous  PONV Risk Score and Plan: 2 and Ondansetron, Dexamethasone and Midazolam  Airway Management Planned: Oral ETT  Additional Equipment:   Intra-op Plan:   Post-operative Plan: Possible Post-op intubation/ventilation  Informed Consent: I have reviewed the patients History and Physical, chart, labs and discussed the procedure including the risks, benefits and alternatives for the proposed anesthesia with the patient or authorized representative who has indicated his/her understanding and acceptance.     Dental advisory given  Plan Discussed with: CRNA and Anesthesiologist  Anesthesia Plan Comments:        Anesthesia Quick Evaluation

## 2019-07-22 NOTE — Progress Notes (Signed)
Called report to floor RN. Verbalized understanding. Pt transported with two RNs back to floor.

## 2019-07-22 NOTE — Progress Notes (Signed)
Troponin noted, another ordered for 2 hrs after initial draw. This is likely demand ischemia. Doubt ACS.

## 2019-07-22 NOTE — Progress Notes (Signed)
SLP Cancellation Note  Patient Details Name: Nicholas Trujillo Sr. MRN: [id number] DOB: Aug 22, 1928   Cancelled treatment:       Reason Eval/Treat Not Completed: Patient not medically ready(Paulette, RN indicated that the pt is currently NPO for surgery. Swallow evaluation will therefore be deferred at this time and SLP will follow up.)  Temitayo Covalt I. Vear Clock, MS, CCC-SLP Acute Rehabilitation Services Office number [phone] Pager [phone]  Scheryl Marten 07/22/2019, 9:00 AM

## 2019-07-22 NOTE — Progress Notes (Addendum)
S- Called regarding increased work of breathing and possible HR in 30's. Pt's son bedside. Change from baseline mentation where he would be awake at this time. Not responding to verbal stimuli.  O- Increased work of breathing using accessory muscles. Less responsive that this AM.  A- Acute hypoxemic respiratory failure 2/2 acute systolic HF exac w reduced EF and possible PNA; AMS 2/2 HCAP, Sepsis, worsening pulm edema P- Ck stat CXR, CBC, BMP, Mg, Trop, EKG shows irreg irreg rhythm, no signs of ischemia, I recommend against surgery today give this acute change. Start HCAP coverage w Vanc and Cefepime, bid Lasix, one dose now, another this evening. Maintain Foley for strict I/O's. Will ck urine as well and draw blood cultures.  I discussed the plan and findings with the son. Confirmed he is DNR. All of his questions were answered. Will update again tomorrow. Discussed with Dr. Roda Shutters.   Sharlene Dory, DO 2:09 PM 07/22/19

## 2019-07-22 NOTE — Progress Notes (Signed)
PROGRESS NOTE    Nicholas KREJCI Sr.  ZOX:[id number] DOB: 18-Oct-1928 DOA: 07/21/2019 PCP: Patient, No Pcp Per     Brief Narrative:  Patient is an 86 year old male with a past medical history of coronary artery disease, hypertension, hyperlipidemia, diabetes, alcohol abuse, and dementia who presents with an unwitnessed fall at a nursing facility.  Patient is alert and oriented x0 at baseline.  His son is his power of attorney.  Showed to have a right hip fracture on 2/10.  X-ray shows pulmonary edema and was given 40 mg of Lasix once.  CT head and spine unremarkable, orthopedic surgery saw the patient and recommended surgery the following day.  Diffuse prior to admission, the patient was found to some shortness of breath and diagnosed with possible pneumonia.  He was placed on antibiotics at that time but is not currently on any.  New events last 24 hours / Subjective: Patient is lying in bed resting comfortably.  Awakes to painful stimuli.  Is not currently answering questions.  Has surgery today.  Yesterday was having elevated heart rate and respiratory rate, he received another dose of IV Lasix.  Mouth breathing currently on nasal cannula oxygen.  Reportedly dropped to the mid 80s yesterday.  No fevers.  Assessment & Plan:   Principal Problem:   Closed displaced fracture of right femoral neck [facility])  Surgery today  Appreciate orthopedic surgery  Morphine 1 mg every 2 hours as needed for pain  Active Problems:   Acute CHF (congestive heart failure) (HCC)  Will wait to diurese until after surgery, swelling is better today  Will try to wean him off of oxygen and see where saturations lie    New onset atrial fibrillation (HCC)  Metoprolol per cardiology    SIRS (systemic inflammatory response syndrome) (HCC)  Leukocytosis improved    Essential hypertension  Beta-blocker per cardiology    Hyperlipidemia    Diabetes mellitus with hyperglycemia (HCC)  SSI    Urinary  retention  Order the bladder scan, showed 700 mL, will place Foley for both surgery, relief of the retention, and monitoring I/os.  Nutrition Problem: Increased nutrient needs Etiology: hip fracture   DVT prophylaxis: SCDs Code Status: DNR Family Communication: No family bedside Coming From: Nursing facility Disposition Plan: Likely SNF pending therapy recommendations Barriers to Discharge: Clinical improvement  Consultants:   Orthopedic surgery  Cardiology  Antimicrobials:  Anti-infectives (From admission, onward)   Start     Dose/Rate Route Frequency Ordered Stop   07/22/19 0600  ceFAZolin (ANCEF) IVPB 2g/100 mL premix     2 g 200 mL/hr over 30 Minutes Intravenous On call to O.R. 07/21/19 1620 07/23/19 0559       Objective: Vitals:   07/21/19 2100 07/22/19 0500 07/22/19 0720 07/22/19 0742  BP: (!) 131/110 (!) 143/89 (!) 139/111   Pulse: (!) 108 94 (!) 107   Resp: 17 19 20    Temp: 97.6 F (36.4 C) 98.8 F (37.1 C)  97.7 F (36.5 C)  TempSrc: Axillary Axillary  Axillary  SpO2: 94% 98% 95%   Weight:  91.6 kg      Intake/Output Summary (Last 24 hours) at 07/22/2019 1120 Last data filed at 07/21/2019 1300 Gross per 24 hour  Intake 0 ml  Output --  Net 0 ml   Filed Weights   07/22/19 0500  Weight: 91.6 kg    Examination:  General exam: Appears comfortable  Respiratory system: Clear to auscultation. Respiratory effort normal. No respiratory distress. I do  not hear any rales today Cardiovascular system: S1 & S2 heard, RRR. No murmurs. 1+ pitting edema b/l up to mid tibia Gastrointestinal system: Bladder is distended and he grimaces when I push on it Central nervous system: Responds to painful stimuli Extremities: Symmetric in appearance   Data Reviewed: I have personally reviewed following labs and imaging studies  CBC: Recent Labs  Lab 07/21/19 0239 07/21/19 0609 07/22/19 0343  WBC 17.2* 18.6* 11.6*  NEUTROABS 14.5* 15.4*  --   HGB 14.7 14.6 14.4   HCT 43.4 45.1 42.3  MCV 96.4 100.9* 95.3  PLT 200 209 190   Basic Metabolic Panel: Recent Labs  Lab 07/21/19 0239 07/21/19 0827 07/21/19 1140 07/22/19 0343  NA 135 138 139 138  K 5.4* 5.0 4.3 4.0  CL 98 99 102 100  CO2 26 26 21* 24  GLUCOSE 126* 150* 149* 173*  BUN 22 18 21  28*  CREATININE 0.81 0.98 0.99 1.13  CALCIUM 8.9 8.6* 9.1 9.1   GFR: CrCl cannot be calculated (Unknown ideal weight.). Liver Function Tests: Recent Labs  Lab 07/21/19 0827  AST 27  ALT 14  ALKPHOS 80  BILITOT 2.9*  PROT 5.9*  ALBUMIN 3.5   Coagulation Profile: Recent Labs  Lab 07/21/19 0239  INR 1.2   HbA1C: Recent Labs    07/21/19 0631  HGBA1C 5.7*   CBG: Recent Labs  Lab 07/21/19 1839 07/21/19 2148 07/22/19 0518 07/22/19 0738 07/22/19 1054  GLUCAP 162* 152* 142* 132* 142*   Thyroid Function Tests: Recent Labs    07/21/19 0610  TSH 5.813*   Sepsis Labs: Recent Labs  Lab 07/21/19 0827  PROCALCITON <0.10    Recent Results (from the past 240 hour(s))  Respiratory Panel by RT PCR (Flu A&B, Covid) - Nasopharyngeal Swab     Status: None   Collection Time: 07/21/19  4:00 AM   Specimen: Nasopharyngeal Swab  Result Value Ref Range Status   SARS Coronavirus 2 by RT PCR NEGATIVE NEGATIVE Final    Comment: (NOTE) SARS-CoV-2 target nucleic acids are NOT DETECTED. The SARS-CoV-2 RNA is generally detectable in upper respiratoy specimens during the acute phase of infection. The lowest concentration of SARS-CoV-2 viral copies this assay can detect is 131 copies/mL. A negative result does not preclude SARS-Cov-2 infection and should not be used as the sole basis for treatment or other patient management decisions. A negative result may occur with  improper specimen collection/handling, submission of specimen other than nasopharyngeal swab, presence of viral mutation(s) within the areas targeted by this assay, and inadequate number of viral copies (<131 copies/mL). A negative  result must be combined with clinical observations, patient history, and epidemiological information. The expected result is Negative. Fact Sheet for Patients:  09/18/19 Fact Sheet for Healthcare Providers:  https://www.moore.com/ This test is not yet ap proved or cleared by the https://www.young.biz/ FDA and  has been authorized for detection and/or diagnosis of SARS-CoV-2 by FDA under an Emergency Use Authorization (EUA). This EUA will remain  in effect (meaning this test can be used) for the duration of the COVID-19 declaration under Section 564(b)(1) of the Act, 21 U.S.C. section 360bbb-3(b)(1), unless the authorization is terminated or revoked sooner.    Influenza A by PCR NEGATIVE NEGATIVE Final   Influenza B by PCR NEGATIVE NEGATIVE Final    Comment: (NOTE) The Xpert Xpress SARS-CoV-2/FLU/RSV assay is intended as an aid in  the diagnosis of influenza from Nasopharyngeal swab specimens and  should not be used as a  sole basis for treatment. Nasal washings and  aspirates are unacceptable for Xpert Xpress SARS-CoV-2/FLU/RSV  testing. Fact Sheet for Patients: https://www.moore.com/ Fact Sheet for Healthcare Providers: https://www.young.biz/ This test is not yet approved or cleared by the [place] FDA and  has been authorized for detection and/or diagnosis of SARS-CoV-2 by  FDA under an Emergency Use Authorization (EUA). This EUA will remain  in effect (meaning this test can be used) for the duration of the  Covid-19 declaration under Section 564(b)(1) of the Act, 21  U.S.C. section 360bbb-3(b)(1), unless the authorization is  terminated or revoked. Performed at [facility], [address] [place], [place] [address]   Surgical PCR screen     Status: Abnormal   Collection Time: 07/22/19  5:24 AM   Specimen: Nasal Mucosa; Nasal Swab  Result Value Ref Range Status   MRSA, PCR POSITIVE  (A) NEGATIVE Final    Comment: RESULT CALLED TO, READ BACK BY AND VERIFIED WITH: RN Cheri Guppy [phone] FCP    Staphylococcus aureus POSITIVE (A) NEGATIVE Final    Comment: (NOTE) The Xpert SA Assay (FDA approved for NASAL specimens in patients 18 years of age and older), is one component of a comprehensive surveillance program. It is not intended to diagnose infection nor to guide or monitor treatment. Performed at [facility], [address] [place], [place] [address]       Radiology Studies: CT Head Wo Contrast  Result Date: 07/21/2019 CLINICAL DATA:  Fall EXAM: CT HEAD WITHOUT CONTRAST CT CERVICAL SPINE WITHOUT CONTRAST TECHNIQUE: Multidetector CT imaging of the head and cervical spine was performed following the standard protocol without intravenous contrast. Multiplanar CT image reconstructions of the cervical spine were also generated. COMPARISON:  None. FINDINGS: CT HEAD FINDINGS Brain: There is no mass, hemorrhage or extra-axial collection. There is generalized atrophy without lobar predilection. There is hypoattenuation of the periventricular white matter, most commonly indicating chronic ischemic microangiopathy. Vascular: Atherosclerotic calcification of the internal carotid arteries at the skull base. No abnormal hyperdensity of the major intracranial arteries or dural venous sinuses. Skull: The visualized skull base, calvarium and extracranial soft tissues are normal. Sinuses/Orbits: No fluid levels or advanced mucosal thickening of the visualized paranasal sinuses. No mastoid or middle ear effusion. The orbits are normal. CT CERVICAL SPINE FINDINGS Alignment: No static subluxation. Facets are aligned. Occipital condyles are normally positioned. Skull base and vertebrae: No acute fracture. Soft tissues and spinal canal: No prevertebral fluid or swelling. No visible canal hematoma. Disc levels: No advanced spinal canal or neural foraminal stenosis. Upper chest: No  pneumothorax, pulmonary nodule or pleural effusion. Other: Normal visualized paraspinal cervical soft tissues. IMPRESSION: 1. No acute intracranial abnormality. 2. No fracture or static subluxation of the cervical spine. 3. Chronic ischemic microangiopathy and generalized volume loss. Electronically Signed   By: Deatra Robinson M.D.   On: 07/21/2019 03:48   CT Cervical Spine Wo Contrast  Result Date: 07/21/2019 CLINICAL DATA:  Fall EXAM: CT HEAD WITHOUT CONTRAST CT CERVICAL SPINE WITHOUT CONTRAST TECHNIQUE: Multidetector CT imaging of the head and cervical spine was performed following the standard protocol without intravenous contrast. Multiplanar CT image reconstructions of the cervical spine were also generated. COMPARISON:  None. FINDINGS: CT HEAD FINDINGS Brain: There is no mass, hemorrhage or extra-axial collection. There is generalized atrophy without lobar predilection. There is hypoattenuation of the periventricular white matter, most commonly indicating chronic ischemic microangiopathy. Vascular: Atherosclerotic calcification of the internal carotid arteries at the skull  base. No abnormal hyperdensity of the major intracranial arteries or dural venous sinuses. Skull: The visualized skull base, calvarium and extracranial soft tissues are normal. Sinuses/Orbits: No fluid levels or advanced mucosal thickening of the visualized paranasal sinuses. No mastoid or middle ear effusion. The orbits are normal. CT CERVICAL SPINE FINDINGS Alignment: No static subluxation. Facets are aligned. Occipital condyles are normally positioned. Skull base and vertebrae: No acute fracture. Soft tissues and spinal canal: No prevertebral fluid or swelling. No visible canal hematoma. Disc levels: No advanced spinal canal or neural foraminal stenosis. Upper chest: No pneumothorax, pulmonary nodule or pleural effusion. Other: Normal visualized paraspinal cervical soft tissues. IMPRESSION: 1. No acute intracranial abnormality. 2. No  fracture or static subluxation of the cervical spine. 3. Chronic ischemic microangiopathy and generalized volume loss. Electronically Signed   By: Deatra RobinsonKevin  Herman M.D.   On: 07/21/2019 03:48   DG Chest Portable 1 View  Result Date: 07/21/2019 CLINICAL DATA:  Right femoral neck fracture EXAM: PORTABLE CHEST 1 VIEW COMPARISON:  Radiograph 07/28/2014 FINDINGS: There is diffuse hazy interstitial opacity with a perihilar predominance as well as cephalized, indistinct pulmonary vascularity and slight obscuration of the costophrenic sulci. Moderate cardiomegaly is increased from comparison exam. The aorta is calcified. The remaining cardiomediastinal contours are unremarkable. No acute osseous or soft tissue abnormality. Degenerative changes are present in the imaged spine and shoulders. IMPRESSION: Features suggesting CHF/volume overload with edema and cardiomegaly. No acute traumatic findings in the chest. Electronically Signed   By: Kreg ShropshirePrice  DeHay M.D.   On: 07/21/2019 03:18   DG Shoulder Right Portable  Result Date: 07/21/2019 CLINICAL DATA:  Fall EXAM: PORTABLE RIGHT SHOULDER COMPARISON:  None. FINDINGS: Mild soft tissue thickening of the right acromioclavicular joint. There is no evidence of fracture or dislocation. Moderate glenohumeral and acromioclavicular arthrosis. No worrisome osseous lesions. Included portions of the right chest wall are free of acute abnormality. IMPRESSION: Soft tissue thickening at the right acromioclavicular joint may be degenerative in nature however should correlate for point tenderness to exclude a [place] type I acromioclavicular injury. No acute fracture or osseous malalignment is seen. Moderate glenohumeral and acromioclavicular arthrosis. Electronically Signed   By: Kreg ShropshirePrice  DeHay M.D.   On: 07/21/2019 03:13   DG Knee Right Port  Result Date: 07/21/2019 CLINICAL DATA:  Status post fall, pain. EXAM: PORTABLE RIGHT KNEE - 1-2 VIEW COMPARISON:  None. FINDINGS: Generalized  osteopenia. No fracture or dislocation. No aggressive osseous lesion. Small joint effusion. Peripheral vascular atherosclerotic disease. Mild tricompartmental osteoarthritis of the right knee. IMPRESSION: No acute osseous injury of the right knee. Electronically Signed   By: Elige KoHetal  Patel   On: 07/21/2019 08:52   ECHOCARDIOGRAM COMPLETE  Result Date: 07/22/2019    ECHOCARDIOGRAM REPORT   Patient Name:   Nicholas OverlieJohn L Harnish Sr. Date of Exam: 07/22/2019 Medical Rec #:  [id number]          Height: Accession #:    [id number]         Weight:       202.0 lb Date of Birth:  05-05-1929          BSA:          2.03 m Patient Age:    89 years           BP:           143/89 mmHg Patient Gender: M                  HR:  114 bpm. Exam Location:  Inpatient Procedure: 2D Echo Indications:    Cardiomegaly 429.3 / I51.7  History:        Patient has no prior history of Echocardiogram examinations.                 CHF; Arrythmias:Atrial Fibrillation.  Sonographer:    Vikki Ports Turrentine Referring Phys: [phone] LINDSAY B ROBERTS  Sonographer Comments: Image acquisition challenging due to uncooperative patient and Image acquisition challenging due to respiratory motion. IMPRESSIONS  1. Left ventricular ejection fraction, by estimation, is 25 to 30%. The left ventricle has severely decreased function. The left ventrical demonstrates global hypokinesis. The left ventricular internal cavity size was moderately dilated. There is mildly  increased left ventricular hypertrophy. indeterminate due to atrial Fib.  2. Right ventricular systolic function is normal. The right ventricular size is normal.  3. Left atrial size was severely dilated.  4. Right atrial size was moderately dilated.  5. The mitral valve is grossly normal. no evidence of mitral valve regurgitation. No evidence of mitral stenosis.  6. The aortic valve is grossly normal. Aortic valve regurgitation is not visualized. No aortic stenosis is present.  7. Evidence of atrial level  shunting detected by color flow Doppler. FINDINGS  Left Ventricle: Left ventricular ejection fraction, by estimation, is 25 to 30%. The left ventricle has severely decreased function. The left ventricle demonstrates global hypokinesis. The left ventricular internal cavity size was moderately dilated. There is mildly increased left ventricular hypertrophy. Indeterminate due to atrial Fib. Right Ventricle: The right ventricular size is normal. No increase in right ventricular wall thickness. Right ventricular systolic function is normal. Left Atrium: Left atrial size was severely dilated. Right Atrium: Right atrial size was moderately dilated. Pericardium: There is no evidence of pericardial effusion. Mitral Valve: The mitral valve is grossly normal. No evidence of mitral valve regurgitation. No evidence of mitral valve stenosis. Tricuspid Valve: The tricuspid valve is not well visualized. Tricuspid valve regurgitation is not demonstrated. Aortic Valve: The aortic valve is grossly normal. Aortic valve regurgitation is not visualized. No aortic stenosis is present. Moderate aortic valve annular calcification. Pulmonic Valve: The pulmonic valve was grossly normal. Pulmonic valve regurgitation is trivial. Aorta: The aortic root, ascending aorta and aortic arch are all structurally normal, with no evidence of dilitation or obstruction. IAS/Shunts: Evidence of atrial level shunting detected by color flow Doppler.  LEFT VENTRICLE PLAX 2D LVIDd:         5.82 cm LVIDs:         4.88 cm LV PW:         1.23 cm LV IVS:        1.20 cm LVOT diam:     2.20 cm LVOT Area:     3.80 cm  LEFT ATRIUM            Index LA diam:      6.20 cm  3.05 cm/m LA Vol (A4C): 101.0 ml 49.69 ml/m   AORTA Ao Root diam: 2.70 cm  SHUNTS Systemic Diam: 2.20 cm Mertie Moores MD Electronically signed by Mertie Moores MD Signature Date/Time: 07/22/2019/11:05:43 AM    Final    DG HIP UNILAT WITH PELVIS 2-3 VIEWS RIGHT  Result Date: 07/21/2019 CLINICAL  DATA:  Fall EXAM: DG HIP (WITH OR WITHOUT PELVIS) 2-3V RIGHT COMPARISON:  None. FINDINGS: There is a transcervical right femoral neck fracture with varus angulation and external rotation across the fracture line. No other acute fracture is seen of the bony  pelvis or proximal left femur. Femoral heads remain normally located. Normal bowel gas pattern. Extensive vascular calcium in the pelvis. Soft tissue swelling of the right hip is noted. IMPRESSION: Transcervical right femoral neck fracture with varus angulation and external rotation across the fracture line. Absence Electronically Signed   By: Kreg Shropshire M.D.   On: 07/21/2019 03:11     Scheduled Meds: . Chlorhexidine Gluconate Cloth  6 each Topical Q0600  . feeding supplement (ENSURE ENLIVE)  237 mL Oral TID BM  . feeding supplement  296 mL Oral Once  . feeding supplement (PRO-STAT SUGAR FREE 64)  30 mL Oral TID  . metoprolol tartrate  5 mg Intravenous Q6H  . multivitamin with minerals  1 tablet Oral Daily  . mupirocin ointment  1 application Nasal BID  . mupirocin ointment  1 application Nasal BID  . povidone-iodine  2 application Topical Once  . tranexamic acid (CYKLOKAPRON) topical - INTRAOP  2,000 mg Topical To OR   Continuous Infusions: .  ceFAZolin (ANCEF) IV    . tranexamic acid       LOS: 1 day    Time spent: 30 minutes   Sharlene Dory, DO [facility] 07/22/2019, 11:20 AM   Available via Epic secure chat 7am-7pm After these hours, please refer to coverage provider listed on [email or web address]

## 2019-07-22 NOTE — Progress Notes (Signed)
Spoke with Dr. Carmelia Roller about AMS and likely pneumonia.  Surgery postponed until medically optimized.  Son updated at bedside.  Mayra Reel, MD [facility] [phone] 1:42 PM

## 2019-07-22 NOTE — Progress Notes (Signed)
Performed bladder scan - 706 ml. Inserted Foley catheter per MD order with 825 ml amber, clear urine returned in bag prior to transferring to OR for surgery. Son, POA at the bedside and accompanied patient to short stay due to patient impaired mental status. Mikal Plane, BSN. RN

## 2019-07-22 NOTE — Progress Notes (Signed)
Dr. Roda Shutters notified there is a delay in the patient being ready for surgery.  Patient to be seen by hospitalist and have portable chest xray performed.

## 2019-07-22 NOTE — Progress Notes (Addendum)
Progress Note  Patient Name: Nicholas SETO Sr. Date of Encounter: 07/22/2019  Primary Cardiologist: No primary care provider on file.   Subjective   Laying in bed, snoring. Does not awaken to verbal stimuli  Inpatient Medications    Scheduled Meds: . Chlorhexidine Gluconate Cloth  6 each Topical Q0600  . feeding supplement (ENSURE ENLIVE)  237 mL Oral TID BM  . feeding supplement  296 mL Oral Once  . feeding supplement (PRO-STAT SUGAR FREE 64)  30 mL Oral TID  . multivitamin with minerals  1 tablet Oral Daily  . mupirocin ointment  1 application Nasal BID  . mupirocin ointment  1 application Nasal BID  . povidone-iodine  2 application Topical Once  . tranexamic acid (CYKLOKAPRON) topical - INTRAOP  2,000 mg Topical To OR   Continuous Infusions: .  ceFAZolin (ANCEF) IV    . tranexamic acid     PRN Meds: LORazepam, metoprolol tartrate, morphine injection   Vital Signs    Vitals:   07/21/19 2100 07/22/19 0500 07/22/19 0720 07/22/19 0742  BP: (!) 131/110 (!) 143/89 (!) 139/111   Pulse: (!) 108 94 (!) 107   Resp: 17 19 20    Temp: 97.6 F (36.4 C) 98.8 F (37.1 C)  97.7 F (36.5 C)  TempSrc: Axillary Axillary  Axillary  SpO2: 94% 98% 95%   Weight:  91.6 kg      Intake/Output Summary (Last 24 hours) at 07/22/2019 0846 Last data filed at 07/21/2019 1300 Gross per 24 hour  Intake 0 ml  Output --  Net 0 ml   Last 3 Weights 07/22/2019  Weight (lbs) 202 lb  Weight (kg) 91.627 kg      Telemetry    Afib rates 110s-140s - Personally Reviewed  ECG    No new tracing  Physical Exam  Confused older male, laying bed with mitten restraints on. GEN: No acute distress.   Neck: No JVD Cardiac: Irreg Irreg, no murmurs, rubs, or gallops.  Respiratory: Clear to auscultation bilaterally. GI: Soft, nontender, non-distended  MS: mild LE edema; No deformity. Neuro:  Nonfocal  Psych: confused    Labs    High Sensitivity Troponin:   Recent Labs  Lab 07/21/19 0239  07/21/19 0827  TROPONINIHS 29* 32*      Chemistry Recent Labs  Lab 07/21/19 0827 07/21/19 1140 07/22/19 0343  NA 138 139 138  K 5.0 4.3 4.0  CL 99 102 100  CO2 26 21* 24  GLUCOSE 150* 149* 173*  BUN 18 21 28*  CREATININE 0.98 0.99 1.13  CALCIUM 8.6* 9.1 9.1  PROT 5.9*  --   --   ALBUMIN 3.5  --   --   AST 27  --   --   ALT 14  --   --   ALKPHOS 80  --   --   BILITOT 2.9*  --   --   GFRNONAA >60 >60 58*  GFRAA >60 >60 >60  ANIONGAP 13 16* 14     Hematology Recent Labs  Lab 07/21/19 0239 07/21/19 0609 07/22/19 0343  WBC 17.2* 18.6* 11.6*  RBC 4.50 4.47 4.44  HGB 14.7 14.6 14.4  HCT 43.4 45.1 42.3  MCV 96.4 100.9* 95.3  MCH 32.7 32.7 32.4  MCHC 33.9 32.4 34.0  RDW 14.5 14.9 14.6  PLT 200 209 190    BNP Recent Labs  Lab 07/21/19 0239  BNP 655.7*     DDimer No results for input(s): DDIMER in the last  168 hours.   Radiology    CT Head Wo Contrast  Result Date: 07/21/2019 CLINICAL DATA:  Fall EXAM: CT HEAD WITHOUT CONTRAST CT CERVICAL SPINE WITHOUT CONTRAST TECHNIQUE: Multidetector CT imaging of the head and cervical spine was performed following the standard protocol without intravenous contrast. Multiplanar CT image reconstructions of the cervical spine were also generated. COMPARISON:  None. FINDINGS: CT HEAD FINDINGS Brain: There is no mass, hemorrhage or extra-axial collection. There is generalized atrophy without lobar predilection. There is hypoattenuation of the periventricular white matter, most commonly indicating chronic ischemic microangiopathy. Vascular: Atherosclerotic calcification of the internal carotid arteries at the skull base. No abnormal hyperdensity of the major intracranial arteries or dural venous sinuses. Skull: The visualized skull base, calvarium and extracranial soft tissues are normal. Sinuses/Orbits: No fluid levels or advanced mucosal thickening of the visualized paranasal sinuses. No mastoid or middle ear effusion. The orbits are  normal. CT CERVICAL SPINE FINDINGS Alignment: No static subluxation. Facets are aligned. Occipital condyles are normally positioned. Skull base and vertebrae: No acute fracture. Soft tissues and spinal canal: No prevertebral fluid or swelling. No visible canal hematoma. Disc levels: No advanced spinal canal or neural foraminal stenosis. Upper chest: No pneumothorax, pulmonary nodule or pleural effusion. Other: Normal visualized paraspinal cervical soft tissues. IMPRESSION: 1. No acute intracranial abnormality. 2. No fracture or static subluxation of the cervical spine. 3. Chronic ischemic microangiopathy and generalized volume loss. Electronically Signed   By: Deatra Robinson M.D.   On: 07/21/2019 03:48   CT Cervical Spine Wo Contrast  Result Date: 07/21/2019 CLINICAL DATA:  Fall EXAM: CT HEAD WITHOUT CONTRAST CT CERVICAL SPINE WITHOUT CONTRAST TECHNIQUE: Multidetector CT imaging of the head and cervical spine was performed following the standard protocol without intravenous contrast. Multiplanar CT image reconstructions of the cervical spine were also generated. COMPARISON:  None. FINDINGS: CT HEAD FINDINGS Brain: There is no mass, hemorrhage or extra-axial collection. There is generalized atrophy without lobar predilection. There is hypoattenuation of the periventricular white matter, most commonly indicating chronic ischemic microangiopathy. Vascular: Atherosclerotic calcification of the internal carotid arteries at the skull base. No abnormal hyperdensity of the major intracranial arteries or dural venous sinuses. Skull: The visualized skull base, calvarium and extracranial soft tissues are normal. Sinuses/Orbits: No fluid levels or advanced mucosal thickening of the visualized paranasal sinuses. No mastoid or middle ear effusion. The orbits are normal. CT CERVICAL SPINE FINDINGS Alignment: No static subluxation. Facets are aligned. Occipital condyles are normally positioned. Skull base and vertebrae: No acute  fracture. Soft tissues and spinal canal: No prevertebral fluid or swelling. No visible canal hematoma. Disc levels: No advanced spinal canal or neural foraminal stenosis. Upper chest: No pneumothorax, pulmonary nodule or pleural effusion. Other: Normal visualized paraspinal cervical soft tissues. IMPRESSION: 1. No acute intracranial abnormality. 2. No fracture or static subluxation of the cervical spine. 3. Chronic ischemic microangiopathy and generalized volume loss. Electronically Signed   By: Deatra Robinson M.D.   On: 07/21/2019 03:48   DG Chest Portable 1 View  Result Date: 07/21/2019 CLINICAL DATA:  Right femoral neck fracture EXAM: PORTABLE CHEST 1 VIEW COMPARISON:  Radiograph 07/28/2014 FINDINGS: There is diffuse hazy interstitial opacity with a perihilar predominance as well as cephalized, indistinct pulmonary vascularity and slight obscuration of the costophrenic sulci. Moderate cardiomegaly is increased from comparison exam. The aorta is calcified. The remaining cardiomediastinal contours are unremarkable. No acute osseous or soft tissue abnormality. Degenerative changes are present in the imaged spine and shoulders. IMPRESSION: Features suggesting  CHF/volume overload with edema and cardiomegaly. No acute traumatic findings in the chest. Electronically Signed   By: Kreg Shropshire M.D.   On: 07/21/2019 03:18   DG Shoulder Right Portable  Result Date: 07/21/2019 CLINICAL DATA:  Fall EXAM: PORTABLE RIGHT SHOULDER COMPARISON:  None. FINDINGS: Mild soft tissue thickening of the right acromioclavicular joint. There is no evidence of fracture or dislocation. Moderate glenohumeral and acromioclavicular arthrosis. No worrisome osseous lesions. Included portions of the right chest wall are free of acute abnormality. IMPRESSION: Soft tissue thickening at the right acromioclavicular joint may be degenerative in nature however should correlate for point tenderness to exclude a [place] type I acromioclavicular  injury. No acute fracture or osseous malalignment is seen. Moderate glenohumeral and acromioclavicular arthrosis. Electronically Signed   By: Kreg Shropshire M.D.   On: 07/21/2019 03:13   DG Knee Right Port  Result Date: 07/21/2019 CLINICAL DATA:  Status post fall, pain. EXAM: PORTABLE RIGHT KNEE - 1-2 VIEW COMPARISON:  None. FINDINGS: Generalized osteopenia. No fracture or dislocation. No aggressive osseous lesion. Small joint effusion. Peripheral vascular atherosclerotic disease. Mild tricompartmental osteoarthritis of the right knee. IMPRESSION: No acute osseous injury of the right knee. Electronically Signed   By: Elige Ko   On: 07/21/2019 08:52   DG HIP UNILAT WITH PELVIS 2-3 VIEWS RIGHT  Result Date: 07/21/2019 CLINICAL DATA:  Fall EXAM: DG HIP (WITH OR WITHOUT PELVIS) 2-3V RIGHT COMPARISON:  None. FINDINGS: There is a transcervical right femoral neck fracture with varus angulation and external rotation across the fracture line. No other acute fracture is seen of the bony pelvis or proximal left femur. Femoral heads remain normally located. Normal bowel gas pattern. Extensive vascular calcium in the pelvis. Soft tissue swelling of the right hip is noted. IMPRESSION: Transcervical right femoral neck fracture with varus angulation and external rotation across the fracture line. Absence Electronically Signed   By: Kreg Shropshire M.D.   On: 07/21/2019 03:11    Cardiac Studies   TTE: pending  Patient Profile     86 y.o. male with a hx of HTN, HL, DM, ETOH use and dementia who is being seen today for the evaluation of preoperative evaluation at the request of Dr. Carmelia Roller.   Assessment & Plan    1. Preoperative Evaluation for right hip fracture: presented [facility] at [facility]. Now with right hip fracture. In talking with his son he has been off all medications for about 10 years. Does report a hx of cardiac stent about 25 years ago. No chest pain prior to admission, did have some  shortness of breath noted but recently Dx with PNA and being treated for the same. Given his advanced age he is at increased risk.  -- echo pending read  2. New onset afib RVR: noted on admission to the ED. Rates still elevated.  -- started metoprolol 25mg  BID yesterday but noted to have difficulty swallowing. Will transition to scheduled IV metoprolol 5mg  q6hr. -- This patients CHA2DS2-VASc Score of at least 4. No anticoagulation at this time as he is pending surgery. Given his advanced dementia and freq falls would not consider him a long term candidate for OAC.   3. Recent PNA: son reported he was placed on antibiotics several days prior to admission. WBC elevated on admission. Management per primary  4. Acute HF: etiology unknown. Edema and cardiomegaly noted on CXR. Given IV lasix on admission. Little UOP noted but he is incontinent as well.  -- received additional  IV lasix yesterday, laying in bed flat today  For questions or updates, please contact [facility] Please consult [email or web address] for contact info under        Signed, Laverda Page, NP  07/22/2019, 8:46 AM    Agree with note by Laverda Page NP-C  Atrial fibrillation ventricular response in the low 100 range.  Patient has a hip fracture we are asked to see for preoperative clearance.  2D echo is pending.  He is not an anticoagulation candidate.  We are happy to assist with rate control with IV beta-blocker.  Runell Gess, M.D., FACP, [facility], [facility] [address] [place], [place]  [address]  [phone] 07/22/2019 9:42 AM

## 2019-07-22 NOTE — Progress Notes (Signed)
Pharmacy Antibiotic Note  Nicholas CARBONNEAU Sr. is a 86 y.o. male admitted on 07/21/2019 with pneumonia.  Pharmacy has been consulted for vancomycin and cefepime dosing.  Plan: 1. Vancomycin 1500 mg q 24 hrs (AUC est 499 w/ Scr 1.13) 2. Cefepime 2g IV q 12 hrs. 3. Will f/u cultures, renal function and clinical course.  Height: 6' (182.9 cm) Weight: 202 lb (91.6 kg) IBW/kg (Calculated) : 77.6  Temp (24hrs), Avg:98 F (36.7 C), Min:97.6 F (36.4 C), Max:98.8 F (37.1 C)  Recent Labs  Lab 07/21/19 0239 07/21/19 0609 07/21/19 0827 07/21/19 1140 07/22/19 0343  WBC 17.2* 18.6*  --   --  11.6*  CREATININE 0.81  --  0.98 0.99 1.13    Estimated Creatinine Clearance: 49.6 mL/min (by C-G formula based on SCr of 1.13 mg/dL).    Allergies  Allergen Reactions  . Atorvastatin Other (See Comments)    Myalgia Other reaction(s): Myalgias (intolerance)   . Codeine Nausea And Vomiting    Antimicrobials this admission:  Cefepime 2/11 > Vancomycin 2/11 >  Dose adjustments this admission:    Microbiology results:  2/11 BCx x 2: 2/11 UCx x 2: 2/11 MRSA PCR: + 2/10 Flu/COVID neg  Thank you for allowing pharmacy to be a part of this patient's care.  Jenetta Downer, [facility] Clinical Pharmacist Phone [phone]  07/22/2019 2:37 PM

## 2019-07-22 NOTE — H&P (Signed)
PREOPERATIVE H&P  Chief Complaint: right femoral neck fracture  HPI: Nicholas L Nudo Sr. is a 86 y.o. male who presents for surgical treatment of right femoral neck fracture.  He denies any changes in medical history.  Past Medical History:  Diagnosis Date  . Alcohol abuse   . Alzheimer's dementia, late onset ([place])   . Hyperlipemia   . Hypertension   . Type 2 diabetes mellitus ([place])    Past Surgical History:  Procedure Laterality Date  . CHOLECYSTECTOMY    . CORONARY ANGIOPLASTY    . PROSTATE SURGERY     Social History   Socioeconomic History  . Marital status: Married    Spouse name: Not on file  . Number of children: Not on file  . Years of education: Not on file  . Highest education level: Not on file  Occupational History  . Not on file  Tobacco Use  . Smoking status: Former Research scientist (life sciences)  . Smokeless tobacco: Never Used  Substance and Sexual Activity  . Alcohol use: Not Currently  . Drug use: Not on file  . Sexual activity: Not on file  Other Topics Concern  . Not on file  Social History Narrative  . Not on file   Social Determinants of Health   Financial Resource Strain:   . Difficulty of Paying Living Expenses: Not on file  Food Insecurity:   . Worried About Charity fundraiser in the Last Year: Not on file  . Ran Out of Food in the Last Year: Not on file  Transportation Needs:   . Lack of Transportation (Medical): Not on file  . Lack of Transportation (Non-Medical): Not on file  Physical Activity:   . Days of Exercise per Week: Not on file  . Minutes of Exercise per Session: Not on file  Stress:   . Feeling of Stress : Not on file  Social Connections:   . Frequency of Communication with Friends and Family: Not on file  . Frequency of Social Gatherings with Friends and Family: Not on file  . Attends Religious Services: Not on file  . Active Member of Clubs or Organizations: Not on file  . Attends Archivist Meetings: Not on file  .  Marital Status: Not on file   Family History  Problem Relation Age of Onset  . Cancer Mother    Allergies  Allergen Reactions  . Atorvastatin Other (See Comments)    Myalgia Other reaction(s): Myalgias (intolerance)   . Codeine Nausea And Vomiting   Prior to Admission medications   Medication Sig Start Date End Date Taking? Authorizing Provider  acetaminophen (TYLENOL) 500 MG tablet Take 1,000 mg by mouth 3 (three) times daily.   Yes [provider]  divalproex (DEPAKOTE SPRINKLE) 125 MG capsule Take 250 mg by mouth at bedtime.   Yes [provider]  escitalopram (LEXAPRO) 10 MG tablet Take 10 mg by mouth at bedtime.   Yes [provider]  ipratropium-albuterol (DUONEB) 0.5-2.5 (3) MG/3ML SOLN Take 3 mLs by nebulization every 8 (eight) hours.   Yes [provider]  Lidocaine-Menthol [facility] PAIN RELIEF) 3.99-1.25 % PTCH Apply 1 patch topically at bedtime. To low back.  On 12 hours and off 12 hours   Yes [provider]  LORazepam (ATIVAN) 0.5 MG tablet Take 0.5 mg by mouth at bedtime.   Yes [provider]  memantine (NAMENDA) 5 MG tablet Take 5 mg by mouth 2 (two) times daily.   Yes  [provider]  traMADol (ULTRAM) 50 MG tablet Take 50 mg by mouth every 4 (four) hours as needed for moderate pain.   Yes [provider]     Positive ROS: All other systems have been reviewed and were otherwise negative with the exception of those mentioned in the HPI and as above.  Physical Exam: General: Alert, no acute distress Cardiovascular: No pedal edema Respiratory: No cyanosis, no use of accessory musculature GI: abdomen soft Skin: No lesions in the area of chief complaint Neurologic: Sensation intact distally Psychiatric: Patient is competent for consent with normal mood and affect Lymphatic: no lymphedema  MUSCULOSKELETAL: exam stable  Assessment: right femoral neck fracture  Plan: Plan for  Procedure(s): ANTERIOR APPROACH HEMI HIP ARTHROPLASTY  The risks benefits and alternatives were discussed with the patient including but not limited to the risks of nonoperative treatment, versus surgical intervention including infection, bleeding, nerve injury,  blood clots, cardiopulmonary complications, morbidity, mortality, among others, and they were willing to proceed.    Glee Arvin, MD   07/22/2019 8:24 AM

## 2019-07-22 NOTE — Progress Notes (Signed)
  Echocardiogram 2D Echocardiogram has been performed.  Very poor patient compliance. Patient moving arms, making it difficult to maintain images.   Zeda Gangwer A Ladaija Dimino 07/22/2019, 8:26 AM

## 2019-07-23 DIAGNOSIS — J9601 Acute respiratory failure with hypoxia: Secondary | ICD-10-CM

## 2019-07-23 DIAGNOSIS — I5021 Acute systolic (congestive) heart failure: Secondary | ICD-10-CM

## 2019-07-23 LAB — URINE CULTURE: Culture: NO GROWTH

## 2019-07-23 LAB — GLUCOSE, CAPILLARY
Glucose-Capillary: 133 mg/dL — ABNORMAL HIGH (ref 70–99)
Glucose-Capillary: 134 mg/dL — ABNORMAL HIGH (ref 70–99)
Glucose-Capillary: 137 mg/dL — ABNORMAL HIGH (ref 70–99)
Glucose-Capillary: 115 mg/dL — ABNORMAL HIGH (ref 70–99)
Glucose-Capillary: 199 mg/dL — ABNORMAL HIGH (ref 70–99)

## 2019-07-23 LAB — CBC
HCT: 44 % (ref 39.0–52.0)
Hemoglobin: 14.6 g/dL (ref 13.0–17.0)
MCH: 32.2 pg (ref 26.0–34.0)
MCHC: 33.2 g/dL (ref 30.0–36.0)
MCV: 97.1 fL (ref 80.0–100.0)
Platelets: 176 10*3/uL (ref 150–400)
RBC: 4.53 MIL/uL (ref 4.22–5.81)
RDW: 14.8 % (ref 11.5–15.5)
WBC: 12.8 10*3/uL — ABNORMAL HIGH (ref 4.0–10.5)
nRBC: 0 % (ref 0.0–0.2)

## 2019-07-23 LAB — BASIC METABOLIC PANEL WITH GFR
Anion gap: 14 (ref 5–15)
BUN: 37 mg/dL — ABNORMAL HIGH (ref 8–23)
CO2: 26 mmol/L (ref 22–32)
Calcium: 9 mg/dL (ref 8.9–10.3)
Chloride: 100 mmol/L (ref 98–111)
Creatinine, Ser: 0.95 mg/dL (ref 0.61–1.24)
GFR calc Af Amer: >60 mL/min
GFR calc non Af Amer: >60 mL/min
Glucose, Bld: 138 mg/dL — ABNORMAL HIGH (ref 70–99)
Potassium: 3.8 mmol/L (ref 3.5–5.1)
Sodium: 140 mmol/L (ref 135–145)

## 2019-07-23 MED ORDER — ENOXAPARIN SODIUM 40 MG/0.4ML ~~LOC~~ SOLN
40.0000 mg | SUBCUTANEOUS | Status: DC
  Administered 2019-07-23 – 2019-07-25 (×3): 40 mg via SUBCUTANEOUS
  Filled 2019-07-23 (×3): qty 0.4

## 2019-07-23 NOTE — Progress Notes (Signed)
   Subjective:  More alert today Improving overall  Objective:   VITALS:   Vitals:   07/23/19 0341 07/23/19 0351 07/23/19 0800 07/23/19 0815  BP: 131/71 128/78 100/85 128/87  Pulse: (!) 41 91 94 64  Resp: 14 17 16 15   Temp: 98.5 F (36.9 C) 98.8 F (37.1 C)    TempSrc: Axillary Axillary    SpO2: 93% 96% 98% 98%  Weight:  88.9 kg    Height:       Exam stable  Lab Results  Component Value Date   WBC 12.8 (H) 07/23/2019   HGB 14.6 07/23/2019   HCT 44.0 07/23/2019   MCV 97.1 07/23/2019   PLT 176 07/23/2019     Assessment/Plan:  1 Day Post-Op   - medically improving - will plan for surgical repair on Monday  Friday 07/23/2019, 2:32 PM [phone]

## 2019-07-23 NOTE — Progress Notes (Addendum)
Progress Note  Patient Name: Nicholas KEESLING Sr. Date of Encounter: 07/23/2019  Primary Cardiologist: No primary care provider on file.   Patient profile  86 year old male with dementia, CAD, HTN, HLD, diabetes, alcohol abuse, presented with right hip fracture after an unwitnessed fall at nursing facility. Plan to perform ORIF. He was also found to have pneumonia, pulmonary edema and afib and treated with antibiotic and diuretic Cardiology was consulted for management of A. Fib and pre-op evaluation.  Subjective   Patient was seen and evaluated at bedside on morning rounds.  He says few unclears words when we ask him questions. Patient telemetry shows A. fib with PVCs.  He was supposed to go to the ORIF earlier today that was canceled due to reported bradycardia.   Inpatient Medications    Scheduled Meds:  Chlorhexidine Gluconate Cloth  6 each Topical Q0600   feeding supplement (ENSURE ENLIVE)  237 mL Oral TID BM   feeding supplement (PRO-STAT SUGAR FREE 64)  30 mL Oral TID   furosemide  40 mg Intravenous BID   insulin aspart  0-5 Units Subcutaneous QHS   insulin aspart  0-9 Units Subcutaneous TID WC   metoprolol tartrate  5 mg Intravenous Q6H   multivitamin with minerals  1 tablet Oral Daily   mupirocin ointment  1 application Nasal BID   mupirocin ointment  1 application Nasal BID   Continuous Infusions:  ceFEPime (MAXIPIME) IV 2 g (07/23/19 0815)   lactated ringers 10 mL/hr at 07/22/19 1220   vancomycin 1,500 mg (07/22/19 1545)   PRN Meds: LORazepam, morphine injection   Vital Signs    Vitals:   07/23/19 0341 07/23/19 0351 07/23/19 0800 07/23/19 0815  BP: 131/71 128/78 100/85 128/87  Pulse: (!) 41 91 94 64  Resp: 14 17 16 15   Temp: 98.5 F (36.9 C) 98.8 F (37.1 C)    TempSrc: Axillary Axillary    SpO2: 93% 96% 98% 98%  Weight:  88.9 kg    Height:        Intake/Output Summary (Last 24 hours) at 07/23/2019 1207 Last data filed at 07/23/2019 0815 Gross per 24  hour  Intake 400 ml  Output 2700 ml  Net -2300 ml   Last 3 Weights 07/23/2019 07/22/2019 07/22/2019  Weight (lbs) 196 lb 202 lb 202 lb  Weight (kg) 88.905 kg 91.627 kg 91.627 kg      Telemetry    Afib with PVC - Personally Reviewed  ECG    Afib - Personally Reviewed  Physical Exam   GEN: No acute distress. Mittens in place  Neck: No JVD Cardiac: Irregular irregular, no murmurs, rubs, or gallops.  Respiratory: Clear to auscultation on anterior chet wall exam bilaterally. GI: Soft, nontender, non-distended  MS: 1+ bilateral pitting edema Neuro:  Altered and confused  Labs    High Sensitivity Troponin:   Recent Labs  Lab 07/21/19 0239 07/21/19 0827 07/22/19 1529 07/22/19 1800  TROPONINIHS 29* 32* 92* 88*      Chemistry Recent Labs  Lab 07/21/19 0827 07/21/19 1140 07/22/19 0343 07/22/19 1529 07/23/19 0357  NA 138   < > 138 141 140  K 5.0   < > 4.0 4.0 3.8  CL 99   < > 100 100 100  CO2 26   < > 24 27 26   GLUCOSE 150*   < > 173* 149* 138*  BUN 18   < > 28* 34* 37*  CREATININE 0.98   < > 1.13 1.16 0.95  CALCIUM 8.6*   < > 9.1 9.3 9.0  PROT 5.9*  --   --   --   --   ALBUMIN 3.5  --   --   --   --   AST 27  --   --   --   --   ALT 14  --   --   --   --   ALKPHOS 80  --   --   --   --   BILITOT 2.9*  --   --   --   --   GFRNONAA >60   < > 58* 56* >60  GFRAA >60   < > >60 >60 >60  ANIONGAP 13   < > 14 14 14    < > = values in this interval not displayed.     Hematology Recent Labs  Lab 07/22/19 0343 07/22/19 1529 07/23/19 0357  WBC 11.6* 11.9* 12.8*  RBC 4.44 4.66 4.53  HGB 14.4 14.9 14.6  HCT 42.3 44.9 44.0  MCV 95.3 96.4 97.1  MCH 32.4 32.0 32.2  MCHC 34.0 33.2 33.2  RDW 14.6 14.7 14.8  PLT 190 182 176    BNP Recent Labs  Lab 07/21/19 0239  BNP 655.7*     DDimer No results for input(s): DDIMER in the last 168 hours.   Radiology    DG Chest Port 1 View  Result Date: 07/22/2019 CLINICAL DATA:  Respiratory distress. EXAM: PORTABLE  CHEST 1 VIEW COMPARISON:  Radiograph yesterday. FINDINGS: Cardiomegaly unchanged from yesterday. Aortic atherosclerosis and tortuosity. Progressive hazy opacities throughout both lungs likely combination of layering pleural effusions and edema. Pulmonary vasculature is indistinct. No evidence of pneumothorax. No displaced rib fracture. IMPRESSION: Progressive hazy opacities throughout both lungs likely combination of layering pleural effusions and pulmonary edema. Stable cardiomegaly since yesterday. Electronically Signed   By: 09/19/2019 M.D.   On: 07/22/2019 13:20   ECHOCARDIOGRAM COMPLETE  Result Date: 07/22/2019    ECHOCARDIOGRAM REPORT   Patient Name:   Nicholas SAWYERS Sr. Date of Exam: 07/22/2019 Medical Rec #:  09/19/2019          Height: Accession #:    [id number]         Weight:       202.0 lb Date of Birth:  1929-05-17          BSA:          2.03 m Patient Age:    89 years           BP:           143/89 mmHg Patient Gender: M                  HR:           114 bpm. Exam Location:  Inpatient Procedure: 2D Echo Indications:    Cardiomegaly 429.3 / I51.7  History:        Patient has no prior history of Echocardiogram examinations.                 CHF; Arrythmias:Atrial Fibrillation.  Sonographer:    08/03/1930 T[place] Referring Phys: [phone] LINDSAY B ROBERTS  Sonographer Comments: Image acquisition challenging due to uncooperative patient and Image acquisition challenging due to respiratory motion. IMPRESSIONS  1. Left ventricular ejection fraction, by estimation, is 25 to 30%. The left ventricle has severely decreased function. The left ventrical demonstrates global hypokinesis. The left ventricular internal cavity size was moderately dilated. There is  mildly  increased left ventricular hypertrophy. indeterminate due to atrial Fib.  2. Right ventricular systolic function is normal. The right ventricular size is normal.  3. Left atrial size was severely dilated.  4. Right atrial size was moderately  dilated.  5. The mitral valve is grossly normal. no evidence of mitral valve regurgitation. No evidence of mitral stenosis.  6. The aortic valve is grossly normal. Aortic valve regurgitation is not visualized. No aortic stenosis is present.  7. Evidence of atrial level shunting detected by color flow Doppler. FINDINGS  Left Ventricle: Left ventricular ejection fraction, by estimation, is 25 to 30%. The left ventricle has severely decreased function. The left ventricle demonstrates global hypokinesis. The left ventricular internal cavity size was moderately dilated. There is mildly increased left ventricular hypertrophy. Indeterminate due to atrial Fib. Right Ventricle: The right ventricular size is normal. No increase in right ventricular wall thickness. Right ventricular systolic function is normal. Left Atrium: Left atrial size was severely dilated. Right Atrium: Right atrial size was moderately dilated. Pericardium: There is no evidence of pericardial effusion. Mitral Valve: The mitral valve is grossly normal. No evidence of mitral valve regurgitation. No evidence of mitral valve stenosis. Tricuspid Valve: The tricuspid valve is not well visualized. Tricuspid valve regurgitation is not demonstrated. Aortic Valve: The aortic valve is grossly normal. Aortic valve regurgitation is not visualized. No aortic stenosis is present. Moderate aortic valve annular calcification. Pulmonic Valve: The pulmonic valve was grossly normal. Pulmonic valve regurgitation is trivial. Aorta: The aortic root, ascending aorta and aortic arch are all structurally normal, with no evidence of dilitation or obstruction. IAS/Shunts: Evidence of atrial level shunting detected by color flow Doppler.  LEFT VENTRICLE PLAX 2D LVIDd:         5.82 cm LVIDs:         4.88 cm LV PW:         1.23 cm LV IVS:        1.20 cm LVOT diam:     2.20 cm LVOT Area:     3.80 cm  LEFT ATRIUM            Index LA diam:      6.20 cm  3.05 cm/m LA Vol (A4C): 101.0 ml  49.69 ml/m   AORTA Ao Root diam: 2.70 cm  SHUNTS Systemic Diam: 2.20 cm Mertie Moores MD Electronically signed by Mertie Moores MD Signature Date/Time: 07/22/2019/11:05:43 AM    Final     Cardiac Studies   Echo 07/22/2019 IMPRESSIONS     1. Left ventricular ejection fraction, by estimation, is 25 to 30%. The  left ventricle has severely decreased function. The left ventrical  demonstrates global hypokinesis. The left ventricular internal cavity size  was moderately dilated. There is mildly   increased left ventricular hypertrophy. indeterminate due to atrial Fib.   2. Right ventricular systolic function is normal. The right ventricular  size is normal.   3. Left atrial size was severely dilated.   4. Right atrial size was moderately dilated.   5. The mitral valve is grossly normal. no evidence of mitral valve  regurgitation. No evidence of mitral stenosis.   6. The aortic valve is grossly normal. Aortic valve regurgitation is not  visualized. No aortic stenosis is present.   7. Evidence of atrial level shunting detected by color flow Doppler.   Patient Profile     86 y.o. male with dementia, CAD, HTN, HLD, diabetes, alcohol abuse, presented with right hip fracture after an  unwitnessed fall at nursing facility. Plan to perform ORIF. He was also found to have pneumonia, pulmonary edema and afib and treated with antibiotic and diuretic Cardiology was consulted for management of A. Fib and pre op evaluation.  Assessment & Plan    1. Preoperative Evaluation for right hip fracture:   He has of cardiac stent 25 years ago. has been off all medications for about 10 years per his son. No chest pain prior to admission. No significant elevation of cardiac enxyme: Trop 29>32>92>88. He has cardiomegaly on CXR. Echo showed LVEF 25-30%.  He recently had shortness of breath and was diagnosed with pneumonia for that he was prescribed oral antibiotic then. Now with right hip fracture after an  unwitnessed fall in nursing facility.  -Patient with sever low EF with unknown etiology and also his advanced age, comorbidities and uncontrolled medical conditions, he is at increased risk.    2. New onset afib RVR: noted on this admission to the ED.  He is on IV BB (difficulty swallowing with PO meds) HR has been around 90-100. Reported bradycardia does not appear to be accurate.  -Continue scheduled IV metoprolol 5mg  q6hr. -May consolidate rate control post-op - This patients CHA2DS2-VASc Score of at least 4.  But no anticoagulation at this time as he is pending surgery.  He is also a poor candidate for anticoagulation here or DOAC after discharge given advanced dementia and frequent falls.  -Continue cardiac monitoring  3. Recent PNA:  Per patient's son, Mr. Othniel Maret has been prescribed antibiotic prior to admission. He has mild leukocytosis. Afebrile. BC no growth <24 h -Continue IV Vanc and Cefepime per primary team.    4. Acute HFrEF:  Severe LV dysfunction, cardiomegaly on chest x-ray and peripheral edema on exam.  Unknown etiology.  etiology unknown. Edema and cardiomegaly noted on CXR. Given IV lasix on admission. Net -3375 cc reported as today. CR and electrolytes stable.   -Continue IV Lasix 40 mg BID - BMP daily. Replace electrolytes as needed - Strict I/Os - Weight daily  DM:  SSI per primary team  For questions or updates, please contact [facility] Please consult [email or web address] for contact info under        Signed, Almon Register, MD  07/23/2019, 12:07 PM     Agree with note by Dr 09/20/2019  We are asked to see this patient for preoperative management of his A. fib.  He is not an anticoagulation candidate.  He is on every 6 beta-blocker IV for rate control.  He fractured his hip and needs ORIF.  He also has pneumonia on antibiotics.  Echo showed severe LV dysfunction of unknown etiology.  I think this is a new finding.  He has I believe advanced  dementia and has difficulty communicating.  Overall, I think he is at relatively high risk for surgery given his severe LV dysfunction.  We will continue to follow him with you.  Maryla Morrow, M.D., FACP, [facility], [facility] [address] [place], [place]  [place]  [phone] 07/23/2019 2:23 PM

## 2019-07-23 NOTE — Progress Notes (Addendum)
PROGRESS NOTE    Nicholas Trujillo.  VPX:[id number] DOB: 10-25-1928 DOA: 07/21/2019 PCP: Patient, No Pcp Per     Brief Narrative:  Patient is an 86 year old male with a past medical history of coronary artery disease, hypertension, hyperlipidemia, diabetes, alcohol abuse, and dementia who presents with an unwitnessed fall at a nursing facility.  Patient is alert and oriented x0 at baseline.  His son is his power of attorney.  Showed to have a right hip fracture on 2/10.  X-ray shows pulmonary edema and was given 40 mg of Lasix once.  CT head and spine unremarkable, orthopedic surgery saw the patient and recommended surgery the following day.  Diffuse prior to admission, the patient was found to some shortness of breath and diagnosed with possible pneumonia.  He was placed on antibiotics at that time but is not currently on any.  Patient was seen just prior to surgery on 2/11.  He was working harder to breathe and not responding to verbal stimuli.  Chest x-ray was obtained that showed worsening pulmonary edema.  Due to concern for pneumonia given his altered mental status, HCAP coverage was started in addition to IV Lasix twice daily.  Surgery was canceled.  New events last 24 hours / Subjective: Troponin trended up but did not increase over 2 hours.  Likely due to demand ischemia.  Today he is resting more comfortably.  He will awaken to verbal stimuli but does not give his normal "clich answers" per his son.  White count crept up to 12.8.  Renal function is stable.  Assessment & Plan:   Principal Problem:   Acute CHF (congestive heart failure) (HCC)             Lasix 40 mg twice daily  Strict I/os, Foley catheter in place  Daily weight  Monitor BMP  He is currently n.p.o. due to encephalopathy  Will wean from oxygen as able  Active Problems:   Closed displaced fracture of right femoral neck (HCC)             Evaluating for potential surgery tomorrow would be reasonable pending  continued clinical improvement             Appreciate orthopedic surgery             Morphine 1 mg every 2 hours as needed for pain    HCAP  Vanc + Cefepime, appreciate pharm input  Blood cx's pending     Acute hypoxemic respiratory failure  Supplemental oxygen as above  Likely secondary to #1 and possibly pneumonia    New onset atrial fibrillation (HCC)             Metoprolol per cardiology    Encephalopathy  Improved today  Tx as above, likely 2/2 hypoxemia and possible metabolic from PNA    Sepsis  WBC, RR, HR  Blood cx's as above  Tx as above    Essential hypertension             Beta-blocker per cardiology    Hyperlipidemia    Diabetes mellitus with hyperglycemia (HCC)             SSI    Urinary retention             Foley in place  Nutrition Problem: Increased nutrient needs Etiology: hip fracture   DVT prophylaxis: SCDs Code Status: DNR Family Communication: No family bedside; I did call Fayrene Fearing, his son and updated him Coming From: Nursing facility  Disposition Plan: Likely SNF pending therapy recommendations Barriers to Discharge: Clinical improvement  Consultants:   Orthopedic surgery  Cardiology  Antimicrobials:  Anti-infectives (From admission, onward)   Start     Dose/Rate Route Frequency Ordered Stop   07/22/19 1445  vancomycin (VANCOREADY) IVPB 1500 mg/300 mL     1,500 mg 150 mL/hr over 120 Minutes Intravenous Every 24 hours 07/22/19 1433     07/22/19 1430  ceFEPIme (MAXIPIME) 2 g in sodium chloride 0.9 % 100 mL IVPB     2 g 200 mL/hr over 30 Minutes Intravenous Every 12 hours 07/22/19 1427     07/22/19 0600  ceFAZolin (ANCEF) IVPB 2g/100 mL premix  Status:  Discontinued     2 g 200 mL/hr over 30 Minutes Intravenous On call to O.R. 07/21/19 1620 07/22/19 1359        Objective: Vitals:   07/23/19 0341 07/23/19 0351 07/23/19 0800 07/23/19 0815  BP: 131/71 128/78 100/85 128/87  Pulse: (!) 41 91 94 64  Resp: 14 17 16 15     Temp: 98.5 F (36.9 C) 98.8 F (37.1 C)    TempSrc: Axillary Axillary    SpO2: 93% 96% 98% 98%  Weight:  88.9 kg    Height:        Intake/Output Summary (Last 24 hours) at 07/23/2019 1112 Last data filed at 07/23/2019 0815 Gross per 24 hour  Intake 400 ml  Output 3775 ml  Net -3375 ml   Filed Weights   07/22/19 0500 07/22/19 1217 07/23/19 0351  Weight: 91.6 kg 91.6 kg 88.9 kg    Examination:  General exam: Appears calm and comfortable  Respiratory system: Clear to auscultation. Respiratory effort normal. No respiratory distress. No conversational dyspnea.  Cardiovascular system: S1 & S2 heard, irreg irreg, reg rate. No murmurs. No pedal edema. Gastrointestinal system: Abdomen is nondistended, soft and he does seem uncomfortable when I push on his suprapubic region. Normal bowel sounds heard. Central nervous system: Awakens to verbal stimuli, will give some answers Extremities: Symmetric in appearance  Skin: No rashes, lesions or ulcers on exposed skin  Psychiatry: Unable to ascertain  Data Reviewed: I have personally reviewed following labs and imaging studies  CBC: Recent Labs  Lab 07/21/19 0239 07/21/19 0609 07/22/19 0343 07/22/19 1529 07/23/19 0357  WBC 17.2* 18.6* 11.6* 11.9* 12.8*  NEUTROABS 14.5* 15.4*  --   --   --   HGB 14.7 14.6 14.4 14.9 14.6  HCT 43.4 45.1 42.3 44.9 44.0  MCV 96.4 100.9* 95.3 96.4 97.1  PLT 200 209 190 182 176   Basic Metabolic Panel: Recent Labs  Lab 07/21/19 0827 07/21/19 1140 07/22/19 0343 07/22/19 1529 07/23/19 0357  NA 138 139 138 141 140  K 5.0 4.3 4.0 4.0 3.8  CL 99 102 100 100 100  CO2 26 21* 24 27 26   GLUCOSE 150* 149* 173* 149* 138*  BUN 18 21 28* 34* 37*  CREATININE 0.98 0.99 1.13 1.16 0.95  CALCIUM 8.6* 9.1 9.1 9.3 9.0  MG  --   --   --  2.0  --    GFR: Estimated Creatinine Clearance: 59 mL/min (by C-G formula based on SCr of 0.95 mg/dL). Liver Function Tests: Recent Labs  Lab 07/21/19 0827  AST 27  ALT  14  ALKPHOS 80  BILITOT 2.9*  PROT 5.9*  ALBUMIN 3.5   Coagulation Profile: Recent Labs  Lab 07/21/19 0239  INR 1.2   HbA1C: Recent Labs    07/21/19 0631  HGBA1C  5.7*   CBG: Recent Labs  Lab 07/22/19 1054 07/22/19 1606 07/22/19 2051 07/23/19 0346 07/23/19 0752  GLUCAP 142* 135* 128* 133* 134*   Thyroid Function Tests: Recent Labs    07/21/19 0610  TSH 5.813*   Sepsis Labs: Recent Labs  Lab 07/21/19 0827  PROCALCITON <0.10    Recent Results (from the past 240 hour(s))  Respiratory Panel by RT PCR (Flu A&B, Covid) - Nasopharyngeal Swab     Status: None   Collection Time: 07/21/19  4:00 AM   Specimen: Nasopharyngeal Swab  Result Value Ref Range Status   SARS Coronavirus 2 by RT PCR NEGATIVE NEGATIVE Final    Comment: (NOTE) SARS-CoV-2 target nucleic acids are NOT DETECTED. The SARS-CoV-2 RNA is generally detectable in upper respiratoy specimens during the acute phase of infection. The lowest concentration of SARS-CoV-2 viral copies this assay can detect is 131 copies/mL. A negative result does not preclude SARS-Cov-2 infection and should not be used as the sole basis for treatment or other patient management decisions. A negative result may occur with  improper specimen collection/handling, submission of specimen other than nasopharyngeal swab, presence of viral mutation(s) within the areas targeted by this assay, and inadequate number of viral copies (<131 copies/mL). A negative result must be combined with clinical observations, patient history, and epidemiological information. The expected result is Negative. Fact Sheet for Patients:  https://www.moore.com/ Fact Sheet for Healthcare Providers:  https://www.young.biz/ This test is not yet ap proved or cleared by the [place] FDA and  has been authorized for detection and/or diagnosis of SARS-CoV-2 by FDA under an Emergency Use Authorization (EUA). This EUA  will remain  in effect (meaning this test can be used) for the duration of the COVID-19 declaration under Section 564(b)(1) of the Act, 21 U.S.C. section 360bbb-3(b)(1), unless the authorization is terminated or revoked sooner.    Influenza A by PCR NEGATIVE NEGATIVE Final   Influenza B by PCR NEGATIVE NEGATIVE Final    Comment: (NOTE) The Xpert Xpress SARS-CoV-2/FLU/RSV assay is intended as an aid in  the diagnosis of influenza from Nasopharyngeal swab specimens and  should not be used as a sole basis for treatment. Nasal washings and  aspirates are unacceptable for Xpert Xpress SARS-CoV-2/FLU/RSV  testing. Fact Sheet for Patients: https://www.moore.com/ Fact Sheet for Healthcare Providers: https://www.young.biz/ This test is not yet approved or cleared by the [place] FDA and  has been authorized for detection and/or diagnosis of SARS-CoV-2 by  FDA under an Emergency Use Authorization (EUA). This EUA will remain  in effect (meaning this test can be used) for the duration of the  Covid-19 declaration under Section 564(b)(1) of the Act, 21  U.S.C. section 360bbb-3(b)(1), unless the authorization is  terminated or revoked. Performed at [facility], [address], [place], [place] [address]   Surgical PCR screen     Status: Abnormal   Collection Time: 07/22/19  5:24 AM   Specimen: Nasal Mucosa; Nasal Swab  Result Value Ref Range Status   MRSA, PCR POSITIVE (A) NEGATIVE Final    Comment: RESULT CALLED TO, READ BACK BY AND VERIFIED WITH: RN Cheri Guppy [phone] FCP    Staphylococcus aureus POSITIVE (A) NEGATIVE Final    Comment: (NOTE) The Xpert SA Assay (FDA approved for NASAL specimens in patients 26 years of age and older), is one component of a comprehensive surveillance program. It is not intended to diagnose infection nor to guide or monitor treatment. Performed at [facility],  [address], [place],  [place] [address]   Culture, blood (routine x 2)     Status: None (Preliminary result)   Collection Time: 07/22/19  3:29 PM   Specimen: BLOOD LEFT HAND  Result Value Ref Range Status   Specimen Description BLOOD LEFT HAND  Final   Special Requests   Final    BOTTLES DRAWN AEROBIC ONLY Blood Culture results may not be optimal due to an inadequate volume of blood received in culture bottles   Culture   Final    NO GROWTH < 24 HOURS Performed at [facility], [address], [place], [place] [address]    Report Status PENDING  Incomplete  Culture, blood (routine x 2)     Status: None (Preliminary result)   Collection Time: 07/22/19  3:37 PM   Specimen: BLOOD RIGHT HAND  Result Value Ref Range Status   Specimen Description BLOOD RIGHT HAND  Final   Special Requests   Final    BOTTLES DRAWN AEROBIC ONLY Blood Culture results may not be optimal due to an inadequate volume of blood received in culture bottles   Culture   Final    NO GROWTH < 24 HOURS Performed at [facility], [address] [place], [place] [address]    Report Status PENDING  Incomplete     Radiology Studies: DG Chest Port 1 View  Result Date: 07/22/2019 CLINICAL DATA:  Respiratory distress. EXAM: PORTABLE CHEST 1 VIEW COMPARISON:  Radiograph yesterday. FINDINGS: Cardiomegaly unchanged from yesterday. Aortic atherosclerosis and tortuosity. Progressive hazy opacities throughout both lungs likely combination of layering pleural effusions and edema. Pulmonary vasculature is indistinct. No evidence of pneumothorax. No displaced rib fracture. IMPRESSION: Progressive hazy opacities throughout both lungs likely combination of layering pleural effusions and pulmonary edema. Stable cardiomegaly since yesterday. Electronically Signed   By: Narda Rutherford M.D.   On: 07/22/2019 13:20   ECHOCARDIOGRAM COMPLETE  Result Date: 07/22/2019    ECHOCARDIOGRAM REPORT   Patient Name:   CORI HENNINGSEN Trujillo. Date of Exam: 07/22/2019 Medical  Rec #:  [id number]          Height: Accession #:    [id number]         Weight:       202.0 lb Date of Birth:  Feb 14, 1929          BSA:          2.03 m Patient Age:    89 years           BP:           143/89 mmHg Patient Gender: M                  HR:           114 bpm. Exam Location:  Inpatient Procedure: 2D Echo Indications:    Cardiomegaly 429.3 / I51.7  History:        Patient has no prior history of Echocardiogram examinations.                 CHF; Arrythmias:Atrial Fibrillation.  Sonographer:    Leeroy Bock Turrentine Referring Phys: [id number] LINDSAY B ROBERTS  Sonographer Comments: Image acquisition challenging due to uncooperative patient and Image acquisition challenging due to respiratory motion. IMPRESSIONS  1. Left ventricular ejection fraction, by estimation, is 25 to 30%. The left ventricle has severely decreased function. The left ventrical demonstrates global hypokinesis. The left ventricular internal cavity size was moderately dilated. There  is mildly  increased left ventricular hypertrophy. indeterminate due to atrial Fib.  2. Right ventricular systolic function is normal. The right ventricular size is normal.  3. Left atrial size was severely dilated.  4. Right atrial size was moderately dilated.  5. The mitral valve is grossly normal. no evidence of mitral valve regurgitation. No evidence of mitral stenosis.  6. The aortic valve is grossly normal. Aortic valve regurgitation is not visualized. No aortic stenosis is present.  7. Evidence of atrial level shunting detected by color flow Doppler. FINDINGS  Left Ventricle: Left ventricular ejection fraction, by estimation, is 25 to 30%. The left ventricle has severely decreased function. The left ventricle demonstrates global hypokinesis. The left ventricular internal cavity size was moderately dilated. There is mildly increased left ventricular hypertrophy. Indeterminate due to atrial Fib. Right Ventricle: The right ventricular size is normal. No increase in  right ventricular wall thickness. Right ventricular systolic function is normal. Left Atrium: Left atrial size was severely dilated. Right Atrium: Right atrial size was moderately dilated. Pericardium: There is no evidence of pericardial effusion. Mitral Valve: The mitral valve is grossly normal. No evidence of mitral valve regurgitation. No evidence of mitral valve stenosis. Tricuspid Valve: The tricuspid valve is not well visualized. Tricuspid valve regurgitation is not demonstrated. Aortic Valve: The aortic valve is grossly normal. Aortic valve regurgitation is not visualized. No aortic stenosis is present. Moderate aortic valve annular calcification. Pulmonic Valve: The pulmonic valve was grossly normal. Pulmonic valve regurgitation is trivial. Aorta: The aortic root, ascending aorta and aortic arch are all structurally normal, with no evidence of dilitation or obstruction. IAS/Shunts: Evidence of atrial level shunting detected by color flow Doppler.  LEFT VENTRICLE PLAX 2D LVIDd:         5.82 cm LVIDs:         4.88 cm LV PW:         1.23 cm LV IVS:        1.20 cm LVOT diam:     2.20 cm LVOT Area:     3.80 cm  LEFT ATRIUM            Index LA diam:      6.20 cm  3.05 cm/m LA Vol (A4C): 101.0 ml 49.69 ml/m   AORTA Ao Root diam: 2.70 cm  SHUNTS Systemic Diam: 2.20 cm Mertie Moores MD Electronically signed by Mertie Moores MD Signature Date/Time: 07/22/2019/11:05:43 AM    Final      Scheduled Meds: . Chlorhexidine Gluconate Cloth  6 each Topical Q0600  . feeding supplement (ENSURE ENLIVE)  237 mL Oral TID BM  . feeding supplement (PRO-STAT SUGAR FREE 64)  30 mL Oral TID  . furosemide  40 mg Intravenous BID  . insulin aspart  0-5 Units Subcutaneous QHS  . insulin aspart  0-9 Units Subcutaneous TID WC  . metoprolol tartrate  5 mg Intravenous Q6H  . multivitamin with minerals  1 tablet Oral Daily  . mupirocin ointment  1 application Nasal BID  . mupirocin ointment  1 application Nasal BID   Continuous  Infusions: . ceFEPime (MAXIPIME) IV 2 g (07/23/19 0815)  . lactated ringers 10 mL/hr at 07/22/19 1220  . vancomycin 1,500 mg (07/22/19 1545)     LOS: 2 days    Time spent: 25 minutes   Shelda Pal, DO [facility] 07/23/2019, 11:12 AM   Available via Epic secure chat 7am-7pm After these hours, please refer to coverage provider listed on [email or web address]

## 2019-07-23 NOTE — Evaluation (Signed)
Clinical/Bedside Swallow Evaluation Patient Details  Name: Nicholas Nicholas Trujillo Sr. MRN: [id number] Date of Birth: Nicholas Trujillo 26, 1930  Today's Date: 07/23/2019 Time: SLP Start Time (ACUTE ONLY): 1107 SLP Stop Time (ACUTE ONLY): 1131 SLP Time Calculation (min) (ACUTE ONLY): 24 min  Past Medical History:  Past Medical History:  Diagnosis Date  . Alcohol abuse   . Alzheimer's dementia, late onset ([place])   . Hyperlipemia   . Hypertension   . Type 2 diabetes mellitus ([place])    Past Surgical History:  Past Surgical History:  Procedure Laterality Date  . CHOLECYSTECTOMY    . CORONARY ANGIOPLASTY    . PROSTATE SURGERY     HPI:  Pt is an 86 y.o. male with history of alcohol abuse previous history of diabetes CAD status post stenting many years ago with history of alcohol abuse who had a compression fracture around 3 to 4 months ago. Two days prior pt's was found to be having some shortness of breath and was diagnosed with possible pneumonia. He presented to the ED after an unwitnessed fall at the nursing facility. CT of the head was negative. Chest x-ray of 2/10 revealed  CHF/volume overload with edema and cardiomegaly. WBC is currently elevated. Pt is afebrile. Chest x-ray of 2/11: Progressive hazy opacities throughout both lungs likely combination of layering pleural effusions and pulmonary edema   Assessment / Plan / Recommendation Clinical Impression  Pt was cooperative throughout the evaluation but exhibited confusion and difficulty following commands. He frequently asked whether he was in a bus and where he was despite the SLP re-orienting him. Pt was unable to reliably provide history or participate in an oral mechanism exam due to his current mentation. Oral mucosa was dry with dried mucous/secretions were removed during oral care. He exhibited a single cough following the initial puree bolus but tolerated all subsequent trials of puree and thin liquids without overt s/sx of aspiration. An oral/pharyngeal  delay was initially suspected but timeliness improved with additional trials. A regular texture solid was provided but bolus manipulation was absent and it was ultimately removed from the pt's lips. A puree diet with thin liquids is recommended at this time. SLP will follow to assess diet tolerance and to determine his ability to tolerate more advanced solids.  SLP Visit Diagnosis: Dysphagia, oral phase (R13.11)    Aspiration Risk  Mild aspiration risk    Diet Recommendation Dysphagia 1 (Puree);Thin liquid   Liquid Administration via: Cup;Straw Medication Administration: Crushed with puree Supervision: Patient able to self feed Compensations: Slow rate;Small sips/bites;Minimize environmental distractions    Other  Recommendations Oral Care Recommendations: Oral care BID   Follow up Recommendations Other (comment)(TBD)      Frequency and Duration min 2x/week  2 weeks       Prognosis Prognosis for Safe Diet Advancement: Fair Barriers to Reach Goals: Cognitive deficits      Swallow Study   General Date of Onset: 07/22/19 HPI: Pt is an 86 y.o. male with history of alcohol abuse previous history of diabetes CAD status post stenting many years ago with history of alcohol abuse who had a compression fracture around 3 to 4 months ago. Two days prior pt's was found to be having some shortness of breath and was diagnosed with possible pneumonia. He presented to the ED after an unwitnessed fall at the nursing facility. CT of the head was negative. Chest x-ray of 2/10 revealed  CHF/volume overload with edema and cardiomegaly. WBC is currently elevated. Pt is afebrile. Chest x-ray  of 2/11: Progressive hazy opacities throughout both lungs likely combination of layering pleural effusions and pulmonary edema Type of Study: Bedside Swallow Evaluation Diet Prior to this Study: NPO Temperature Spikes Noted: No Respiratory Status: Nasal cannula History of Recent Intubation: No Behavior/Cognition:  Alert;Confused Oral Care Completed by SLP: Yes Oral Cavity - Dentition: Adequate natural dentition Vision: Functional for self-feeding Self-Feeding Abilities: Total assist Patient Positioning: Upright in bed;Postural control adequate for testing Baseline Vocal Quality: Normal Volitional Swallow: Able to elicit    Oral/Motor/Sensory Function Overall Oral Motor/Sensory Function: Other (comment)(Pt unable to follow the necessary commands for oral mech. )   Ice Chips Ice chips: Within functional limits Presentation: Spoon   Thin Liquid Thin Liquid: Within functional limits Presentation: Cup;Spoon;Straw    Nectar Thick Nectar Thick Liquid: Not tested   Honey Thick Honey Thick Liquid: Not tested   Puree Puree: Within functional limits(Single cough after the first bolus not with 10 subsequent)   Solid     Solid: Impaired Oral Phase Impairments: Other (comment)(Absent bolus manipulation)     Sheba Whaling I. Vear Clock, MS, CCC-SLP Acute Rehabilitation Services Office number [phone] Pager [phone]  Scheryl Marten 07/23/2019,3:09 PM

## 2019-07-23 NOTE — Plan of Care (Signed)
  Problem: Clinical Measurements: Goal: Respiratory complications will improve Outcome: Progressing   Problem: Coping: Goal: Level of anxiety will decrease Outcome: Progressing   Problem: Pain Managment: Goal: General experience of comfort will improve Outcome: Progressing   Problem: Safety: Goal: Ability to remain free from injury will improve Outcome: Progressing   Problem: Education: Goal: Knowledge of General Education information will improve Description: Including pain rating scale, medication(s)/side effects and non-pharmacologic comfort measures Outcome: Not Applicable Pt continues to be disorientedx4

## 2019-07-24 LAB — BASIC METABOLIC PANEL WITH GFR
Anion gap: 13 (ref 5–15)
BUN: 36 mg/dL — ABNORMAL HIGH (ref 8–23)
CO2: 31 mmol/L (ref 22–32)
Calcium: 8.8 mg/dL — ABNORMAL LOW (ref 8.9–10.3)
Chloride: 100 mmol/L (ref 98–111)
Creatinine, Ser: 0.96 mg/dL (ref 0.61–1.24)
GFR calc Af Amer: >60 mL/min
GFR calc non Af Amer: >60 mL/min
Glucose, Bld: 117 mg/dL — ABNORMAL HIGH (ref 70–99)
Potassium: 3 mmol/L — ABNORMAL LOW (ref 3.5–5.1)
Sodium: 144 mmol/L (ref 135–145)

## 2019-07-24 LAB — CBC
HCT: 44.7 % (ref 39.0–52.0)
Hemoglobin: 14.9 g/dL (ref 13.0–17.0)
MCH: 32.3 pg (ref 26.0–34.0)
MCHC: 33.3 g/dL (ref 30.0–36.0)
MCV: 96.8 fL (ref 80.0–100.0)
Platelets: 165 10*3/uL (ref 150–400)
RBC: 4.62 MIL/uL (ref 4.22–5.81)
RDW: 14.6 % (ref 11.5–15.5)
WBC: 13 10*3/uL — ABNORMAL HIGH (ref 4.0–10.5)
nRBC: 0 % (ref 0.0–0.2)

## 2019-07-24 LAB — GLUCOSE, CAPILLARY
Glucose-Capillary: 127 mg/dL — ABNORMAL HIGH (ref 70–99)
Glucose-Capillary: 210 mg/dL — ABNORMAL HIGH (ref 70–99)
Glucose-Capillary: 137 mg/dL — ABNORMAL HIGH (ref 70–99)
Glucose-Capillary: 137 mg/dL — ABNORMAL HIGH (ref 70–99)

## 2019-07-24 MED ORDER — ESCITALOPRAM OXALATE 10 MG PO TABS
10.0000 mg | ORAL_TABLET | Freq: Every day | ORAL | Status: DC
  Administered 2019-07-24: 23:00:00 10 mg via ORAL
  Filled 2019-07-24 (×2): qty 1

## 2019-07-24 MED ORDER — IPRATROPIUM-ALBUTEROL 0.5-2.5 (3) MG/3ML IN SOLN
3.0000 mL | Freq: Three times a day (TID) | RESPIRATORY_TRACT | Status: DC
  Administered 2019-07-24 – 2019-07-26 (×3): 3 mL via RESPIRATORY_TRACT
  Filled 2019-07-24 (×5): qty 3

## 2019-07-24 MED ORDER — LIDOCAINE 5 % EX PTCH
1.0000 | MEDICATED_PATCH | Freq: Every day | CUTANEOUS | Status: DC
  Administered 2019-07-24 – 2019-07-31 (×8): 1 via TRANSDERMAL
  Filled 2019-07-24 (×9): qty 1

## 2019-07-24 MED ORDER — LORAZEPAM 0.5 MG PO TABS
0.5000 mg | ORAL_TABLET | Freq: Every day | ORAL | Status: DC
  Administered 2019-07-24: 23:00:00 0.5 mg via ORAL
  Filled 2019-07-24 (×2): qty 1

## 2019-07-24 MED ORDER — POTASSIUM CHLORIDE 20 MEQ PO PACK
20.0000 meq | PACK | Freq: Three times a day (TID) | ORAL | Status: DC
  Administered 2019-07-24 (×3): 20 meq via ORAL
  Filled 2019-07-24 (×5): qty 1

## 2019-07-24 MED ORDER — DIVALPROEX SODIUM 125 MG PO CSDR
250.0000 mg | DELAYED_RELEASE_CAPSULE | Freq: Every day | ORAL | Status: DC
  Administered 2019-07-24: 23:00:00 250 mg via ORAL
  Filled 2019-07-24 (×2): qty 2

## 2019-07-24 MED ORDER — POTASSIUM CHLORIDE 20 MEQ PO PACK
20.0000 meq | PACK | Freq: Two times a day (BID) | ORAL | Status: DC
  Filled 2019-07-24: qty 1

## 2019-07-24 MED ORDER — MEMANTINE HCL 5 MG PO TABS
5.0000 mg | ORAL_TABLET | Freq: Two times a day (BID) | ORAL | Status: DC
  Administered 2019-07-24 – 2019-07-25 (×3): 5 mg via ORAL
  Filled 2019-07-24 (×5): qty 1

## 2019-07-24 MED ORDER — POTASSIUM CHLORIDE 20 MEQ PO PACK: 20.0000 meq | PACK | Freq: Three times a day (TID) | ORAL | Status: DC

## 2019-07-24 MED ORDER — LIDOCAINE-MENTHOL 3.99-1.25 % EX PTCH: 1.0000 | MEDICATED_PATCH | Freq: Every day | CUTANEOUS | Status: DC

## 2019-07-24 NOTE — Progress Notes (Addendum)
PROGRESS NOTE    Nicholas MOFIELD Sr.  LGX:[id number] DOB: 1929/03/21 DOA: 07/21/2019  PCP: Patient, No Pcp Per    Brief Narrative: Nicholas Moeller Sr. is a 86 y.o. male with history of alcohol abuse previous history of diabetes CAD status post stenting many years ago with history of alcohol abuse who had a compression fracture around 3 to 4 months ago and was admitted at [facility] and subsequently discharged to rehab and has been at this time permanently in a nursing facility due to recurrent falls and dementia prior to which patient used to drink alcohol every day and has not had any alcohol in the last 3 months was brought to the ER after patient had an unwitnessed fall at the nursing facility.  2 days ago as per the patient's son patient was found to be having some shortness of breath and was diagnosed with possible pneumonia.  Was placed on antibiotics.  Patient does not provide good history but does not complain of any chest pain.  Exact same circumstances of the fall is not clear. Patient was scheduled for surgical fixation of fractured hip by Ortho and was noted to have shortness of breath.  Chest x-ray showed worsening pulmonary edema.  Patient was recently treated for pneumonia and chest x-ray finding likely parapneumonic effusion.  He was started on broad-spectrum antibiotics to cover for HCAP and Lasix for diuresis. Today patient appears confused and not oriented x3.  Notable agitation requiring bilateral upper extremity mittens. Patient was reported with episodes of V. tach which are nonsustained.  Cardiology is following. He is tentatively planned for surgery by Ortho on 07/26/2019   Assessment & Plan:   Principal Problem:   Closed displaced fracture of right femoral neck (HCC) Active Problems:   Acute CHF (congestive heart failure) ([place])   New onset atrial fibrillation (HCC)   Hip fracture (HCC)   SIRS (systemic inflammatory response syndrome) ([place])   Essential  hypertension   Hyperlipidemia   Diabetes mellitus with hyperglycemia ([place])   Urinary retention   Acute hypoxemic respiratory failure ([place])  Clinical problems list 1.  Closed displaced fracture of right femoral neck 2.  Acute hypoxic respiratory failure 3.  New onset atrial fibrillation 4.  Acute metabolic encephalopathy/alcohol abuse 5.  Sepsis, present on admission 6.  Diabetes mellitus type 2 7.  Essential hypertension 8.  Urinary retention/benign prostatic hyperplasia 9.  Acute heart failure with reduced ejection fraction  1.  Closed displaced fracture of right femoral neck.  Ortho has evaluated and plan for surgical fixation on Thursday but patient developed acute hypoxic  respiratory distress along with suspected bradycardia.  Surgery was rescheduled for 07/26/2019. Continue with pain management.  Judicious use of opiates given alteration in mentation.  2.  Acute hypoxic respiratory failure requiring 2 L O2 by nasal cannula..  Patient is status post antibiotic therapy for pneumonia.  Chest x-ray currently showed bilateral hazy opacities concerning for pleural effusion/pulmonary edema.  Possible parapneumonic effusion.  Patient was initiated on broad-spectrum antibiotics Vanco and cefepime due to concern for HCAP. Will monitor with serial chest x-ray for possible loculated effusion and will consult IR for thoracentesis when necessary. Continue with empiric antibiotics as white count has been trending down. Continue with bronchodilators and oxygen supplementation as necessary  3.  New onset atrial fibrillation CHA2DS2-VASc score =5 Heart rate is controlled on IV metoprolol.  Not anticoagulated due to impending surgery. Continue with DVT prophylaxis with Lovenox.  Cardiology following.  Appreciate cardiology recommendation  4.  Acute metabolic encephalopathy/alcohol abuse.  Metabolic encephalopathy in the setting of ongoing pneumonia/sepsis and history of alcohol abuse-Wernicke  encephalopathy Continue to monitor Avoid sedating medications. Judicious use of opiates  5.  Sepsis, likely secondary to pneumonia, present on admission White count trending down with broad-spectrum antibiotics. Will obtain inflammatory markers CRP, ESR and procalcitonin Blood and urine cultures pending final report Continue with Vanco and cefepime  6.  Diabetes mellitus type 2 Continue with sliding scale insulin Fingersticks before meals and at bedtime Hypoglycemic protocol  7.  Essential hypertension Continue with IV beta-blockers  8.  Urinary retention/benign prostatic hyperplasia Patient on Foley catheter Continue with tamsulosin.  Patient not taking orally due to alteration in mentation.  Resume as soon as patient starts oral intake.  9.  Acute heart failure with reduced ejection fraction.  NYHA class II-III, ACC/AHA C.  Last echocardiogram was 11/21 showed EF of 25 to 30%. Continue with guideline directed medical therapy-aspirin, statin, beta-blocker and ACEI. Patient may benefit from initiation of Entresto per PARADIGM-HF Fluid restriction to 1500 cc/day Low-sodium/2 g diet Daily weigh.      DVT prophylaxis: Lovenox subacute  Code Status: DNR  Family Communication: None at bedside  Disposition Plan: Patient is from nursing facility and will be discharged back to nursing facility Barriers to discharge include pending surgical intervention with improvement in clinical status-respiratory distress and sepsis   Consultants:   Cardiology  Orthopedics  Procedures: None  Antimicrobials:  07/22/2019 vancomycin and cefepime    Subjective: Patient was seen and examined at bedside.  He is confused and disoriented.  On 2 L O2 by nasal cannula not in acute distress. Patient has bilateral upper extremity making.  Objective: Vitals:   07/24/19 1357 07/24/19 1400 07/24/19 1405 07/24/19 1553  BP:   (!) 119/91 (!) 119/91  Pulse: 97 93 95 83  Resp:  20 16   Temp:     97.8 F (36.6 C)  TempSrc:    Axillary  SpO2:  95% 99%   Weight:      Height:        Intake/Output Summary (Last 24 hours) at 07/24/2019 1751 Last data filed at 07/24/2019 1747 Gross per 24 hour  Intake 255.5 ml  Output 3900 ml  Net -3644.5 ml   Filed Weights   07/22/19 1217 07/23/19 0351 07/24/19 0448  Weight: 91.6 kg 88.9 kg 87.1 kg    Examination:  General exam: Patient is calm at time of exam with bilateral upper extremity mittens.  Intermittent agitation with pulling up of IV lines.    Respiratory system: Bilateral mid and lower lung fields rhonchi with some crackles.  On 2 L O2 by nasal cannula. Cardiovascular system: S1 & S2 heard, irregular rhythm.  murmurs is present but no rubs, gallops or clicks.  1+ pitting pedal edema  Gastrointestinal system: Abdomen is nondistended, soft and nontender. No organomegaly or masses felt. Normal bowel sounds heard. Central nervous system: Alert and oriented. No focal neurological deficits. Extremities: Decreased/absent motion of the right lower extremity . Skin: No rashes, lesions or ulcers Psychiatry: Patient is confused and not oriented to time person and place.     Data Reviewed: I have personally reviewed following labs and imaging studies  CBC: Recent Labs  Lab 07/21/19 0239 07/21/19 0239 07/21/19 0609 07/22/19 0343 07/22/19 1529 07/23/19 0357 07/24/19 0411  WBC 17.2*   < > 18.6* 11.6* 11.9* 12.8* 13.0*  NEUTROABS 14.5*  --  15.4*  --   --   --   --  HGB 14.7   < > 14.6 14.4 14.9 14.6 14.9  HCT 43.4   < > 45.1 42.3 44.9 44.0 44.7  MCV 96.4   < > 100.9* 95.3 96.4 97.1 96.8  PLT 200   < > 209 190 182 176 165   < > = values in this interval not displayed.   Basic Metabolic Panel: Recent Labs  Lab 07/21/19 1140 07/22/19 0343 07/22/19 1529 07/23/19 0357 07/24/19 0411  NA 139 138 141 140 144  K 4.3 4.0 4.0 3.8 3.0*  CL 102 100 100 100 100  CO2 21* 24 27 26 31   GLUCOSE 149* 173* 149* 138* 117*  BUN 21 28* 34*  37* 36*  CREATININE 0.99 1.13 1.16 0.95 0.96  CALCIUM 9.1 9.1 9.3 9.0 8.8*  MG  --   --  2.0  --   --    GFR: Estimated Creatinine Clearance: 58.4 mL/min (by C-G formula based on SCr of 0.96 mg/dL). Liver Function Tests: Recent Labs  Lab 07/21/19 0827  AST 27  ALT 14  ALKPHOS 80  BILITOT 2.9*  PROT 5.9*  ALBUMIN 3.5   No results for input(s): LIPASE, AMYLASE in the last 168 hours. No results for input(s): AMMONIA in the last 168 hours. Coagulation Profile: Recent Labs  Lab 07/21/19 0239  INR 1.2   Cardiac Enzymes: No results for input(s): CKTOTAL, CKMB, CKMBINDEX, TROPONINI in the last 168 hours. BNP (last 3 results) No results for input(s): PROBNP in the last 8760 hours. HbA1C: No results for input(s): HGBA1C in the last 72 hours. CBG: Recent Labs  Lab 07/23/19 1657 07/23/19 2225 07/24/19 0742 07/24/19 1114 07/24/19 1610  GLUCAP 115* 199* 127* 210* 137*   Lipid Profile: No results for input(s): CHOL, HDL, LDLCALC, TRIG, CHOLHDL, LDLDIRECT in the last 72 hours. Thyroid Function Tests: No results for input(s): TSH, T4TOTAL, FREET4, T3FREE, THYROIDAB in the last 72 hours. Anemia Panel: No results for input(s): VITAMINB12, FOLATE, FERRITIN, TIBC, IRON, RETICCTPCT in the last 72 hours. Sepsis Labs: Recent Labs  Lab 07/21/19 0827  PROCALCITON <0.10    Recent Results (from the past 240 hour(s))  Respiratory Panel by RT PCR (Flu A&B, Covid) - Nasopharyngeal Swab     Status: None   Collection Time: 07/21/19  4:00 AM   Specimen: Nasopharyngeal Swab  Result Value Ref Range Status   SARS Coronavirus 2 by RT PCR NEGATIVE NEGATIVE Final    Comment: (NOTE) SARS-CoV-2 target nucleic acids are NOT DETECTED. The SARS-CoV-2 RNA is generally detectable in upper respiratoy specimens during the acute phase of infection. The lowest concentration of SARS-CoV-2 viral copies this assay can detect is 131 copies/mL. A negative result does not preclude SARS-Cov-2 infection and  should not be used as the sole basis for treatment or other patient management decisions. A negative result may occur with  improper specimen collection/handling, submission of specimen other than nasopharyngeal swab, presence of viral mutation(s) within the areas targeted by this assay, and inadequate number of viral copies (<131 copies/mL). A negative result must be combined with clinical observations, patient history, and epidemiological information. The expected result is Negative. Fact Sheet for Patients:  PinkCheek.be Fact Sheet for Healthcare Providers:  GravelBags.it This test is not yet ap proved or cleared by the [place] FDA and  has been authorized for detection and/or diagnosis of SARS-CoV-2 by FDA under an Emergency Use Authorization (EUA). This EUA will remain  in effect (meaning this test can be used) for the duration of the COVID-19  declaration under Section 564(b)(1) of the Act, 21 U.S.C. section 360bbb-3(b)(1), unless the authorization is terminated or revoked sooner.    Influenza A by PCR NEGATIVE NEGATIVE Final   Influenza B by PCR NEGATIVE NEGATIVE Final    Comment: (NOTE) The Xpert Xpress SARS-CoV-2/FLU/RSV assay is intended as an aid in  the diagnosis of influenza from Nasopharyngeal swab specimens and  should not be used as a sole basis for treatment. Nasal washings and  aspirates are unacceptable for Xpert Xpress SARS-CoV-2/FLU/RSV  testing. Fact Sheet for Patients: PinkCheek.be Fact Sheet for Healthcare Providers: GravelBags.it This test is not yet approved or cleared by the [place] FDA and  has been authorized for detection and/or diagnosis of SARS-CoV-2 by  FDA under an Emergency Use Authorization (EUA). This EUA will remain  in effect (meaning this test can be used) for the duration of the  Covid-19 declaration under Section  564(b)(1) of the Act, 21  U.S.C. section 360bbb-3(b)(1), unless the authorization is  terminated or revoked. Performed at [facility], [place] [address] [place], [place] [address]   Surgical PCR screen     Status: Abnormal   Collection Time: 07/22/19  5:24 AM   Specimen: Nasal Mucosa; Nasal Swab  Result Value Ref Range Status   MRSA, PCR POSITIVE (A) NEGATIVE Final    Comment: RESULT CALLED TO, READ BACK BY AND VERIFIED WITH: RN Claudina Lick [phone] FCP    Staphylococcus aureus POSITIVE (A) NEGATIVE Final    Comment: (NOTE) The Xpert SA Assay (FDA approved for NASAL specimens in patients 30 years of age and older), is one component of a comprehensive surveillance program. It is not intended to diagnose infection nor to guide or monitor treatment. Performed at [facility], [place] [address] [place], [place] [address]   Culture, Urine     Status: None   Collection Time: 07/22/19  2:23 PM   Specimen: Urine, Catheterized  Result Value Ref Range Status   Specimen Description URINE, CATHETERIZED  Final   Special Requests NONE  Final   Culture   Final    NO GROWTH Performed at [facility], [address] [place], [place] [address]    Report Status 07/23/2019 FINAL  Final  Culture, blood (routine x 2)     Status: None (Preliminary result)   Collection Time: 07/22/19  3:29 PM   Specimen: BLOOD LEFT HAND  Result Value Ref Range Status   Specimen Description BLOOD LEFT HAND  Final   Special Requests   Final    BOTTLES DRAWN AEROBIC ONLY Blood Culture results may not be optimal due to an inadequate volume of blood received in culture bottles   Culture   Final    NO GROWTH 2 DAYS Performed at [facility], [place] [address] [place], [place] [address]    Report Status PENDING  Incomplete  Culture, blood (routine x 2)     Status: None (Preliminary result)   Collection Time: 07/22/19  3:37 PM   Specimen: BLOOD RIGHT HAND  Result Value Ref Range Status   Specimen  Description BLOOD RIGHT HAND  Final   Special Requests   Final    BOTTLES DRAWN AEROBIC ONLY Blood Culture results may not be optimal due to an inadequate volume of blood received in culture bottles   Culture   Final    NO GROWTH 2 DAYS Performed at [facility], [place] [address], [place], [place] [address]  Report Status PENDING  Incomplete         Radiology Studies: No results found.      Scheduled Meds: . Chlorhexidine Gluconate Cloth  6 each Topical Q0600  . divalproex  250 mg Oral QHS  . enoxaparin (LOVENOX) injection  40 mg Subcutaneous Q24H  . escitalopram  10 mg Oral QHS  . feeding supplement (ENSURE ENLIVE)  237 mL Oral TID BM  . feeding supplement (PRO-STAT SUGAR FREE 64)  30 mL Oral TID  . furosemide  40 mg Intravenous BID  . insulin aspart  0-5 Units Subcutaneous QHS  . insulin aspart  0-9 Units Subcutaneous TID WC  . ipratropium-albuterol  3 mL Nebulization Q8H  . lidocaine  1 patch Transdermal QHS  . LORazepam  0.5 mg Oral QHS  . memantine  5 mg Oral BID  . metoprolol tartrate  5 mg Intravenous Q6H  . multivitamin with minerals  1 tablet Oral Daily  . mupirocin ointment  1 application Nasal BID  . potassium chloride  20 mEq Oral TID   Continuous Infusions: . ceFEPime (MAXIPIME) IV 2 g (07/24/19 0910)  . lactated ringers 10 mL/hr at 07/22/19 1220  . vancomycin 1,500 mg (07/24/19 1344)     LOS: 3 days    Time spent: 35 minutes   Elie Confer, MD [facility] Pager [phone]   If 7PM-7AM, please contact night-coverage [email or web address] Password [facility] 07/24/2019, 5:51 PM

## 2019-07-24 NOTE — Plan of Care (Signed)
  Problem: Health Behavior/Discharge Planning: Goal: Ability to manage health-related needs will improve Outcome: Progressing   Problem: Clinical Measurements: Goal: Ability to maintain clinical measurements within normal limits will improve Outcome: Progressing Goal: Will remain free from infection Outcome: Progressing Goal: Diagnostic test results will improve Outcome: Progressing Goal: Respiratory complications will improve Outcome: Progressing Goal: Cardiovascular complication will be avoided Outcome: Progressing   Problem: Activity: Goal: Risk for activity intolerance will decrease Outcome: Progressing   Problem: Nutrition: Goal: Adequate nutrition will be maintained Outcome: Progressing   Problem: Coping: Goal: Level of anxiety will decrease Outcome: Progressing   Problem: Elimination: Goal: Will not experience complications related to bowel motility Outcome: Progressing Goal: Will not experience complications related to urinary retention Outcome: Progressing   Problem: Pain Managment: Goal: General experience of comfort will improve Outcome: Progressing   Problem: Safety: Goal: Ability to remain free from injury will improve Outcome: Progressing   Problem: Skin Integrity: Goal: Risk for impaired skin integrity will decrease Outcome: Progressing   Problem: Education: Goal: Verbalization of understanding the information provided (i.e., activity precautions, restrictions, etc) will improve Outcome: Progressing Goal: Individualized Educational Video(s) Outcome: Progressing   Problem: Activity: Goal: Ability to ambulate and perform ADLs will improve Outcome: Progressing   Problem: Clinical Measurements: Goal: Postoperative complications will be avoided or minimized Outcome: Progressing   Problem: Self-Concept: Goal: Ability to maintain and perform role responsibilities to the fullest extent possible will improve Outcome: Progressing   Problem: Pain  Management: Goal: Pain level will decrease Outcome: Progressing   

## 2019-07-24 NOTE — Progress Notes (Addendum)
Progress Note  Patient Name: Nicholas DUFFNER Sr. Date of Encounter: 07/24/2019  Primary Cardiologist: Nanetta Batty, MD   Patient profile  86 year old male with dementia, CAD, HTN, HLD, diabetes, alcohol abuse, presented with right hip fracture after an unwitnessed fall at nursing facility. Plan to perform ORIF. He was also found to have pneumonia, pulmonary edema and afib and treated with antibiotic and diuretic Cardiology was consulted for management of A. Fib and pre-op evaluation.  Subjective   Oriented to name only, does not answer questions, responds to simple commands   Inpatient Medications    Scheduled Meds: . Chlorhexidine Gluconate Cloth  6 each Topical Q0600  . enoxaparin (LOVENOX) injection  40 mg Subcutaneous Q24H  . feeding supplement (ENSURE ENLIVE)  237 mL Oral TID BM  . feeding supplement (PRO-STAT SUGAR FREE 64)  30 mL Oral TID  . furosemide  40 mg Intravenous BID  . insulin aspart  0-5 Units Subcutaneous QHS  . insulin aspart  0-9 Units Subcutaneous TID WC  . metoprolol tartrate  5 mg Intravenous Q6H  . multivitamin with minerals  1 tablet Oral Daily  . mupirocin ointment  1 application Nasal BID  . mupirocin ointment  1 application Nasal BID  . potassium chloride  20 mEq Oral TID   Continuous Infusions: . ceFEPime (MAXIPIME) IV 2 g (07/24/19 0910)  . lactated ringers 10 mL/hr at 07/22/19 1220  . vancomycin 1,500 mg (07/23/19 1545)   PRN Meds: LORazepam, morphine injection   Vital Signs    Vitals:   07/23/19 1950 07/23/19 2222 07/24/19 0448 07/24/19 0743  BP: 121/85 121/73 (!) 138/95 (!) 141/77  Pulse: 87  83 91  Resp: 20 (!) 21 (!) 25 18  Temp:  98.7 F (37.1 C) 97.7 F (36.5 C) 98.5 F (36.9 C)  TempSrc:  Axillary Oral Oral  SpO2: 95% 97% 94% 90%  Weight:   87.1 kg   Height:        Intake/Output Summary (Last 24 hours) at 07/24/2019 0924 Last data filed at 07/24/2019 0900 Gross per 24 hour  Intake 145.5 ml  Output 3500 ml  Net -3354.5  ml   Last 3 Weights 07/24/2019 07/23/2019 07/22/2019  Weight (lbs) 192 lb 196 lb 202 lb  Weight (kg) 87.091 kg 88.905 kg 91.627 kg      Telemetry    Afib, PVCs - Personally Reviewed  ECG    Afib - Personally Reviewed  Physical Exam   General: Well developed, frail, elderly, male in no acute distress Head: Eyes PERRLA, Head normocephalic and atraumatic Lungs: decreased BS bases bilaterally, few rales Heart: Irreg R&R, S1 S2, without rub or gallop. No murmur. 4/4 extremity pulses are 2+ & equal. minimal JVD. Abdomen: Bowel sounds are present, abdomen soft and non-tender without masses or  hernias noted. Msk: Weak strength and tone for age. Extremities: No clubbing, cyanosis or edema.  Outward rotation R foot Skin:  No rashes or lesions noted. Neuro: Alert and oriented X 1. Psych:  Good affect, responds appropriately  Labs    High Sensitivity Troponin:   Recent Labs  Lab 07/21/19 0239 07/21/19 0827 07/22/19 1529 07/22/19 1800  TROPONINIHS 29* 32* 92* 88*      Chemistry Recent Labs  Lab 07/21/19 0827 07/21/19 1140 07/22/19 1529 07/23/19 0357 07/24/19 0411  NA 138   < > 141 140 144  K 5.0   < > 4.0 3.8 3.0*  CL 99   < > 100 100 100  CO2  26   < > 27 26 31   GLUCOSE 150*   < > 149* 138* 117*  BUN 18   < > 34* 37* 36*  CREATININE 0.98   < > 1.16 0.95 0.96  CALCIUM 8.6*   < > 9.3 9.0 8.8*  PROT 5.9*  --   --   --   --   ALBUMIN 3.5  --   --   --   --   AST 27  --   --   --   --   ALT 14  --   --   --   --   ALKPHOS 80  --   --   --   --   BILITOT 2.9*  --   --   --   --   GFRNONAA >60   < > 56* >60 >60  GFRAA >60   < > >60 >60 >60  ANIONGAP 13   < > 14 14 13    < > = values in this interval not displayed.     Hematology Recent Labs  Lab 07/22/19 1529 07/23/19 0357 07/24/19 0411  WBC 11.9* 12.8* 13.0*  RBC 4.66 4.53 4.62  HGB 14.9 14.6 14.9  HCT 44.9 44.0 44.7  MCV 96.4 97.1 96.8  MCH 32.0 32.2 32.3  MCHC 33.2 33.2 33.3  RDW 14.7 14.8 14.6  PLT 182  176 165    BNP Recent Labs  Lab 07/21/19 0239  BNP 655.7*     DDimer No results for input(s): DDIMER in the last 168 hours.   Radiology    DG Chest Port 1 View  Result Date: 07/22/2019 CLINICAL DATA:  Respiratory distress. EXAM: PORTABLE CHEST 1 VIEW COMPARISON:  Radiograph yesterday. FINDINGS: Cardiomegaly unchanged from yesterday. Aortic atherosclerosis and tortuosity. Progressive hazy opacities throughout both lungs likely combination of layering pleural effusions and edema. Pulmonary vasculature is indistinct. No evidence of pneumothorax. No displaced rib fracture. IMPRESSION: Progressive hazy opacities throughout both lungs likely combination of layering pleural effusions and pulmonary edema. Stable cardiomegaly since yesterday. Electronically Signed   By: 09/18/19 M.D.   On: 07/22/2019 13:20    Cardiac Studies   Echo 07/22/2019 IMPRESSIONS     1. Left ventricular ejection fraction, by estimation, is 25 to 30%. The  left ventricle has severely decreased function. The left ventrical  demonstrates global hypokinesis. The left ventricular internal cavity size  was moderately dilated. There is mildly   increased left ventricular hypertrophy. indeterminate due to atrial Fib.   2. Right ventricular systolic function is normal. The right ventricular  size is normal.   3. Left atrial size was severely dilated.   4. Right atrial size was moderately dilated.   5. The mitral valve is grossly normal. no evidence of mitral valve  regurgitation. No evidence of mitral stenosis.   6. The aortic valve is grossly normal. Aortic valve regurgitation is not  visualized. No aortic stenosis is present.   7. Evidence of atrial level shunting detected by color flow Doppler.   Patient Profile     86 y.o. male with dementia, CAD, HTN, HLD, diabetes, alcohol abuse, presented with right hip fracture after an unwitnessed fall at nursing facility. Plan to perform ORIF. He was also found to  have pneumonia, pulmonary edema and afib and treated with antibiotic and diuretic Cardiology was consulted for management of A. Fib and pre op evaluation.  Assessment & Plan    1. Preoperative Evaluation for right hip fracture: - increased  risk for surgery, but no way to ameliorate this - will try to optimize his resp status prior to surgery 02/15   2. New onset afib RVR:  - HR generally controlled on IV BB - CHA2DS2-VASc = 5  (age x 2, CHF, DM, HTN) - DVT lovenox now, preop - however, w/ fall, dementia and general frailty, he is poor anticoag candidate at baseline - defer to MD - continue tele   3. Acute HFrEF:  - new LVD, EF 25-30% - edema improved on IV Lasix, may be able to change to po now or in am - increase K+ supplement to 20 meq tid for K+ 3.0 today - wt down 10 lbs  4. CAD - Hx stent > 20 yr ago  - no ischemic sx - on BB - pta, not on ASA, statin - discuss w/ MD   For questions or updates, please contact [place] Please consult [email or web address] for contact info under     Signed, Rosaria Ferries, PA-C  07/24/2019, 9:24 AM    Patient examined chart reviewed. Would not use long term anticoagulation on this elderly male with ETOH abuse And falls. CHF improved Dementia precludes interview Good diuresis 3.3 L On Defepime for pneumonia clinical Status improving dementia is not  Plan for ortho surgeyr Monday   Jenkins Rouge MD [facility]

## 2019-07-25 ENCOUNTER — Inpatient Hospital Stay (HOSPITAL_COMMUNITY): Payer: Medicare Other

## 2019-07-25 LAB — CBC
HCT: 47.2 % (ref 39.0–52.0)
Hemoglobin: 15.5 g/dL (ref 13.0–17.0)
MCH: 31.8 pg (ref 26.0–34.0)
MCHC: 32.8 g/dL (ref 30.0–36.0)
MCV: 96.9 fL (ref 80.0–100.0)
Platelets: 152 10*3/uL (ref 150–400)
RBC: 4.87 MIL/uL (ref 4.22–5.81)
RDW: 14.6 % (ref 11.5–15.5)
WBC: 13.3 10*3/uL — ABNORMAL HIGH (ref 4.0–10.5)
nRBC: 0 % (ref 0.0–0.2)

## 2019-07-25 LAB — COMPREHENSIVE METABOLIC PANEL
ALT: 127 U/L — ABNORMAL HIGH (ref 0–44)
AST: 74 U/L — ABNORMAL HIGH (ref 15–41)
Albumin: 3 g/dL — ABNORMAL LOW (ref 3.5–5.0)
Alkaline Phosphatase: 91 U/L (ref 38–126)
Anion gap: 11 (ref 5–15)
BUN: 36 mg/dL — ABNORMAL HIGH (ref 8–23)
CO2: 33 mmol/L — ABNORMAL HIGH (ref 22–32)
Calcium: 9 mg/dL (ref 8.9–10.3)
Chloride: 103 mmol/L (ref 98–111)
Creatinine, Ser: 0.94 mg/dL (ref 0.61–1.24)
GFR calc Af Amer: >60 mL/min (ref >60)
GFR calc non Af Amer: >60 mL/min (ref >60)
Glucose, Bld: 145 mg/dL — ABNORMAL HIGH (ref 70–99)
Potassium: 3.2 mmol/L — ABNORMAL LOW (ref 3.5–5.1)
Sodium: 147 mmol/L — ABNORMAL HIGH (ref 135–145)
Total Bilirubin: 2 mg/dL — ABNORMAL HIGH (ref 0.3–1.2)
Total Protein: 5.7 g/dL — ABNORMAL LOW (ref 6.5–8.1)

## 2019-07-25 LAB — GLUCOSE, CAPILLARY
Glucose-Capillary: 134 mg/dL — ABNORMAL HIGH (ref 70–99)
Glucose-Capillary: 144 mg/dL — ABNORMAL HIGH (ref 70–99)
Glucose-Capillary: 162 mg/dL — ABNORMAL HIGH (ref 70–99)
Glucose-Capillary: 169 mg/dL — ABNORMAL HIGH (ref 70–99)
Glucose-Capillary: 241 mg/dL — ABNORMAL HIGH (ref 70–99)

## 2019-07-25 LAB — HEPATITIS PANEL, ACUTE
HCV Ab: NONREACTIVE
Hep A IgM: NONREACTIVE
Hep B C IgM: NONREACTIVE
Hepatitis B Surface Ag: NONREACTIVE

## 2019-07-25 LAB — PHOSPHORUS: Phosphorus: 3.2 mg/dL (ref 2.5–4.6)

## 2019-07-25 LAB — MAGNESIUM: Magnesium: 2 mg/dL (ref 1.7–2.4)

## 2019-07-25 LAB — C-REACTIVE PROTEIN: CRP: 2.9 mg/dL — ABNORMAL HIGH (ref <1.0)

## 2019-07-25 LAB — SEDIMENTATION RATE: Sed Rate: 3 mm/hr (ref 0–16)

## 2019-07-25 MED ORDER — DEXTROSE 5 % IV BOLUS: 250.0000 mL | Freq: Once | INTRAVENOUS | Status: DC

## 2019-07-25 MED ORDER — CARVEDILOL 25 MG PO TABS
25.0000 mg | ORAL_TABLET | Freq: Two times a day (BID) | ORAL | Status: DC
  Administered 2019-07-25: 25 mg via ORAL
  Filled 2019-07-25: qty 1

## 2019-07-25 MED ORDER — POTASSIUM CHLORIDE 20 MEQ PO PACK
20.0000 meq | PACK | Freq: Four times a day (QID) | ORAL | Status: DC
  Administered 2019-07-25 (×3): 20 meq via ORAL
  Filled 2019-07-25 (×6): qty 1

## 2019-07-25 MED ORDER — DEXTROSE 5 % IV BOLUS
150.0000 mL | Freq: Once | INTRAVENOUS | Status: AC
  Administered 2019-07-25: 16:00:00 150 mL via INTRAVENOUS

## 2019-07-25 MED ORDER — CEFAZOLIN SODIUM-DEXTROSE 2-4 GM/100ML-% IV SOLN
2.0000 g | Freq: Once | INTRAVENOUS | Status: AC
  Administered 2019-07-26: 06:00:00 2 g via INTRAVENOUS
  Filled 2019-07-25: qty 100

## 2019-07-25 NOTE — Plan of Care (Signed)
  Problem: Clinical Measurements: Goal: Respiratory complications will improve Outcome: Progressing Goal: Cardiovascular complication will be avoided Outcome: Progressing   

## 2019-07-25 NOTE — Progress Notes (Signed)
[facility] notified at this time that patient refuses to take medications at time by mouth. Pt is and has been disoriented, but is now refusing to take oral medications.

## 2019-07-25 NOTE — Progress Notes (Addendum)
Progress Note  Patient Name: Nicholas COWGER Sr. Date of Encounter: 07/25/2019  Primary Cardiologist: Nanetta Batty, MD   Patient profile  86 year old male with dementia, CAD, HTN, HLD, diabetes, alcohol abuse, presented with right hip fracture after an unwitnessed fall at nursing facility. Plan to perform ORIF. He was also found to have pneumonia, pulmonary edema and afib and treated with antibiotic and diuretic Cardiology was consulted for management of A. Fib and pre-op evaluation.  Subjective   Restless, agitated, responds to touch, not really following commands well.  Inpatient Medications    Scheduled Meds: . Chlorhexidine Gluconate Cloth  6 each Topical Q0600  . divalproex  250 mg Oral QHS  . enoxaparin (LOVENOX) injection  40 mg Subcutaneous Q24H  . escitalopram  10 mg Oral QHS  . feeding supplement (ENSURE ENLIVE)  237 mL Oral TID BM  . feeding supplement (PRO-STAT SUGAR FREE 64)  30 mL Oral TID  . furosemide  40 mg Intravenous BID  . insulin aspart  0-5 Units Subcutaneous QHS  . insulin aspart  0-9 Units Subcutaneous TID WC  . ipratropium-albuterol  3 mL Nebulization Q8H  . lidocaine  1 patch Transdermal QHS  . LORazepam  0.5 mg Oral QHS  . memantine  5 mg Oral BID  . metoprolol tartrate  5 mg Intravenous Q6H  . multivitamin with minerals  1 tablet Oral Daily  . mupirocin ointment  1 application Nasal BID  . potassium chloride  20 mEq Oral TID   Continuous Infusions: . ceFEPime (MAXIPIME) IV 2 g (07/24/19 2220)  . lactated ringers 10 mL/hr at 07/22/19 1220  . vancomycin 1,500 mg (07/24/19 1344)   PRN Meds: LORazepam, morphine injection   Vital Signs    Vitals:   07/24/19 2124 07/25/19 0012 07/25/19 0400 07/25/19 0727  BP:  (!) 144/94 (!) 128/99   Pulse: 86  92 81  Resp: (!) 23  12 20   Temp:   (!) 97.5 F (36.4 C) (!) 97.5 F (36.4 C)  TempSrc:   Axillary Axillary  SpO2: 94%  98%   Weight:   83.5 kg   Height:        Intake/Output Summary (Last 24  hours) at 07/25/2019 0815 Last data filed at 07/25/2019 0357 Gross per 24 hour  Intake 230 ml  Output 3325 ml  Net -3095 ml   Last 3 Weights 07/25/2019 07/24/2019 07/23/2019  Weight (lbs) 184 lb 192 lb 196 lb  Weight (kg) 83.462 kg 87.091 kg 88.905 kg      Telemetry    Afib, PVCs - Personally Reviewed  ECG    Afib - Personally Reviewed  Physical Exam   General: Well developed, frail, elderly, male in no acute distress Head: Eyes PERRLA, Head normocephalic and atraumatic Lungs: rales bilaterally to auscultation. Heart: Irreg R&R, S1 S2, without rub or gallop. No murmur. 4/4 extremity pulses are 2+ & equal. JVD 9 cm. Abdomen: Bowel sounds are present, abdomen soft and non-tender without masses or  hernias noted. Msk: Weak strength and tone for age. Extremities: No clubbing, cyanosis or edema. Outward rotation R leg Skin:  No rashes or lesions noted. Neuro: Alert and oriented X 0.5  Labs    High Sensitivity Troponin:   Recent Labs  Lab 07/21/19 0239 07/21/19 0827 07/22/19 1529 07/22/19 1800  TROPONINIHS 29* 32* 92* 88*      Chemistry Recent Labs  Lab 07/21/19 0827 07/21/19 1140 07/23/19 0357 07/24/19 0411 07/25/19 0353  NA 138   < >  140 144 147*  K 5.0   < > 3.8 3.0* 3.2*  CL 99   < > 100 100 103  CO2 26   < > 26 31 33*  GLUCOSE 150*   < > 138* 117* 145*  BUN 18   < > 37* 36* 36*  CREATININE 0.98   < > 0.95 0.96 0.94  CALCIUM 8.6*   < > 9.0 8.8* 9.0  PROT 5.9*  --   --   --  5.7*  ALBUMIN 3.5  --   --   --  3.0*  AST 27  --   --   --  74*  ALT 14  --   --   --  127*  ALKPHOS 80  --   --   --  91  BILITOT 2.9*  --   --   --  2.0*  GFRNONAA >60   < > >60 >60 >60  GFRAA >60   < > >60 >60 >60  ANIONGAP 13   < > 14 13 11    < > = values in this interval not displayed.     Hematology Recent Labs  Lab 07/23/19 0357 07/24/19 0411 07/25/19 0353  WBC 12.8* 13.0* 13.3*  RBC 4.53 4.62 4.87  HGB 14.6 14.9 15.5  HCT 44.0 44.7 47.2  MCV 97.1 96.8 96.9  MCH  32.2 32.3 31.8  MCHC 33.2 33.3 32.8  RDW 14.8 14.6 14.6  PLT 176 165 152    BNP Recent Labs  Lab 07/21/19 0239  BNP 655.7*     Radiology    CT Head Wo Contrast  Result Date: 07/21/2019 CLINICAL DATA:  Fall EXAM: CT HEAD WITHOUT CONTRAST CT CERVICAL SPINE WITHOUT CONTRAST TECHNIQUE: Multidetector CT imaging of the head and cervical spine was performed following the standard protocol without intravenous contrast. Multiplanar CT image reconstructions of the cervical spine were also generated. COMPARISON:  None. FINDINGS: CT HEAD FINDINGS Brain: There is no mass, hemorrhage or extra-axial collection. There is generalized atrophy without lobar predilection. There is hypoattenuation of the periventricular white matter, most commonly indicating chronic ischemic microangiopathy. Vascular: Atherosclerotic calcification of the internal carotid arteries at the skull base. No abnormal hyperdensity of the major intracranial arteries or dural venous sinuses. Skull: The visualized skull base, calvarium and extracranial soft tissues are normal. Sinuses/Orbits: No fluid levels or advanced mucosal thickening of the visualized paranasal sinuses. No mastoid or middle ear effusion. The orbits are normal. CT CERVICAL SPINE FINDINGS Alignment: No static subluxation. Facets are aligned. Occipital condyles are normally positioned. Skull base and vertebrae: No acute fracture. Soft tissues and spinal canal: No prevertebral fluid or swelling. No visible canal hematoma. Disc levels: No advanced spinal canal or neural foraminal stenosis. Upper chest: No pneumothorax, pulmonary nodule or pleural effusion. Other: Normal visualized paraspinal cervical soft tissues. IMPRESSION: 1. No acute intracranial abnormality. 2. No fracture or static subluxation of the cervical spine. 3. Chronic ischemic microangiopathy and generalized volume loss. Electronically Signed   By: 09/18/2019 M.D.   On: 07/21/2019 03:48   CT Cervical Spine Wo  Contrast  Result Date: 07/21/2019 CLINICAL DATA:  Fall EXAM: CT HEAD WITHOUT CONTRAST CT CERVICAL SPINE WITHOUT CONTRAST TECHNIQUE: Multidetector CT imaging of the head and cervical spine was performed following the standard protocol without intravenous contrast. Multiplanar CT image reconstructions of the cervical spine were also generated. COMPARISON:  None. FINDINGS: CT HEAD FINDINGS Brain: There is no mass, hemorrhage or extra-axial collection. There is generalized atrophy without  lobar predilection. There is hypoattenuation of the periventricular white matter, most commonly indicating chronic ischemic microangiopathy. Vascular: Atherosclerotic calcification of the internal carotid arteries at the skull base. No abnormal hyperdensity of the major intracranial arteries or dural venous sinuses. Skull: The visualized skull base, calvarium and extracranial soft tissues are normal. Sinuses/Orbits: No fluid levels or advanced mucosal thickening of the visualized paranasal sinuses. No mastoid or middle ear effusion. The orbits are normal. CT CERVICAL SPINE FINDINGS Alignment: No static subluxation. Facets are aligned. Occipital condyles are normally positioned. Skull base and vertebrae: No acute fracture. Soft tissues and spinal canal: No prevertebral fluid or swelling. No visible canal hematoma. Disc levels: No advanced spinal canal or neural foraminal stenosis. Upper chest: No pneumothorax, pulmonary nodule or pleural effusion. Other: Normal visualized paraspinal cervical soft tissues. IMPRESSION: 1. No acute intracranial abnormality. 2. No fracture or static subluxation of the cervical spine. 3. Chronic ischemic microangiopathy and generalized volume loss. Electronically Signed   By: Deatra Robinson M.D.   On: 07/21/2019 03:48   DG Chest Port 1 View  Result Date: 07/22/2019 CLINICAL DATA:  Respiratory distress. EXAM: PORTABLE CHEST 1 VIEW COMPARISON:  Radiograph yesterday. FINDINGS: Cardiomegaly unchanged from  yesterday. Aortic atherosclerosis and tortuosity. Progressive hazy opacities throughout both lungs likely combination of layering pleural effusions and edema. Pulmonary vasculature is indistinct. No evidence of pneumothorax. No displaced rib fracture. IMPRESSION: Progressive hazy opacities throughout both lungs likely combination of layering pleural effusions and pulmonary edema. Stable cardiomegaly since yesterday. Electronically Signed   By: Narda Rutherford M.D.   On: 07/22/2019 13:20   DG Chest Portable 1 View  Result Date: 07/21/2019 CLINICAL DATA:  Right femoral neck fracture EXAM: PORTABLE CHEST 1 VIEW COMPARISON:  Radiograph 07/28/2014 FINDINGS: There is diffuse hazy interstitial opacity with a perihilar predominance as well as cephalized, indistinct pulmonary vascularity and slight obscuration of the costophrenic sulci. Moderate cardiomegaly is increased from comparison exam. The aorta is calcified. The remaining cardiomediastinal contours are unremarkable. No acute osseous or soft tissue abnormality. Degenerative changes are present in the imaged spine and shoulders. IMPRESSION: Features suggesting CHF/volume overload with edema and cardiomegaly. No acute traumatic findings in the chest. Electronically Signed   By: Kreg Shropshire M.D.   On: 07/21/2019 03:18   DG Shoulder Right Portable  Result Date: 07/21/2019 CLINICAL DATA:  Fall EXAM: PORTABLE RIGHT SHOULDER COMPARISON:  None. FINDINGS: Mild soft tissue thickening of the right acromioclavicular joint. There is no evidence of fracture or dislocation. Moderate glenohumeral and acromioclavicular arthrosis. No worrisome osseous lesions. Included portions of the right chest wall are free of acute abnormality. IMPRESSION: Soft tissue thickening at the right acromioclavicular joint may be degenerative in nature however should correlate for point tenderness to exclude a [place] type I acromioclavicular injury. No acute fracture or osseous malalignment is  seen. Moderate glenohumeral and acromioclavicular arthrosis. Electronically Signed   By: Kreg Shropshire M.D.   On: 07/21/2019 03:13   DG Knee Right Port  Result Date: 07/21/2019 CLINICAL DATA:  Status post fall, pain. EXAM: PORTABLE RIGHT KNEE - 1-2 VIEW COMPARISON:  None. FINDINGS: Generalized osteopenia. No fracture or dislocation. No aggressive osseous lesion. Small joint effusion. Peripheral vascular atherosclerotic disease. Mild tricompartmental osteoarthritis of the right knee. IMPRESSION: No acute osseous injury of the right knee. Electronically Signed   By: Elige Ko   On: 07/21/2019 08:52   ECHOCARDIOGRAM COMPLETE  Result Date: 07/22/2019    ECHOCARDIOGRAM REPORT   Patient Name:   Nicholas BIBER Sr.  Date of Exam: 07/22/2019 Medical Rec #:  [id number]          Height: Accession #:    [id number]         Weight:       202.0 lb Date of Birth:  1929/02/17          BSA:          2.03 m Patient Age:    88 years           BP:           143/89 mmHg Patient Gender: M                  HR:           114 bpm. Exam Location:  Inpatient Procedure: 2D Echo Indications:    Cardiomegaly 429.3 / I51.7  History:        Patient has no prior history of Echocardiogram examinations.                 CHF; Arrythmias:Atrial Fibrillation.  Sonographer:    Vikki Ports Turrentine Referring Phys: [phone] LINDSAY B ROBERTS  Sonographer Comments: Image acquisition challenging due to uncooperative patient and Image acquisition challenging due to respiratory motion. IMPRESSIONS  1. Left ventricular ejection fraction, by estimation, is 25 to 30%. The left ventricle has severely decreased function. The left ventrical demonstrates global hypokinesis. The left ventricular internal cavity size was moderately dilated. There is mildly  increased left ventricular hypertrophy. indeterminate due to atrial Fib.  2. Right ventricular systolic function is normal. The right ventricular size is normal.  3. Left atrial size was severely dilated.  4. Right  atrial size was moderately dilated.  5. The mitral valve is grossly normal. no evidence of mitral valve regurgitation. No evidence of mitral stenosis.  6. The aortic valve is grossly normal. Aortic valve regurgitation is not visualized. No aortic stenosis is present.  7. Evidence of atrial level shunting detected by color flow Doppler. FINDINGS  Left Ventricle: Left ventricular ejection fraction, by estimation, is 25 to 30%. The left ventricle has severely decreased function. The left ventricle demonstrates global hypokinesis. The left ventricular internal cavity size was moderately dilated. There is mildly increased left ventricular hypertrophy. Indeterminate due to atrial Fib. Right Ventricle: The right ventricular size is normal. No increase in right ventricular wall thickness. Right ventricular systolic function is normal. Left Atrium: Left atrial size was severely dilated. Right Atrium: Right atrial size was moderately dilated. Pericardium: There is no evidence of pericardial effusion. Mitral Valve: The mitral valve is grossly normal. No evidence of mitral valve regurgitation. No evidence of mitral valve stenosis. Tricuspid Valve: The tricuspid valve is not well visualized. Tricuspid valve regurgitation is not demonstrated. Aortic Valve: The aortic valve is grossly normal. Aortic valve regurgitation is not visualized. No aortic stenosis is present. Moderate aortic valve annular calcification. Pulmonic Valve: The pulmonic valve was grossly normal. Pulmonic valve regurgitation is trivial. Aorta: The aortic root, ascending aorta and aortic arch are all structurally normal, with no evidence of dilitation or obstruction. IAS/Shunts: Evidence of atrial level shunting detected by color flow Doppler.  LEFT VENTRICLE PLAX 2D LVIDd:         5.82 cm LVIDs:         4.88 cm LV PW:         1.23 cm LV IVS:        1.20 cm LVOT diam:     2.20 cm LVOT Area:  3.80 cm  LEFT ATRIUM            Index LA diam:      6.20 cm  3.05  cm/m LA Vol (A4C): 101.0 ml 49.69 ml/m   AORTA Ao Root diam: 2.70 cm  SHUNTS Systemic Diam: 2.20 cm Kristeen Miss MD Electronically signed by Kristeen Miss MD Signature Date/Time: 07/22/2019/11:05:43 AM    Final    DG HIP UNILAT WITH PELVIS 2-3 VIEWS RIGHT  Result Date: 07/21/2019 CLINICAL DATA:  Fall EXAM: DG HIP (WITH OR WITHOUT PELVIS) 2-3V RIGHT COMPARISON:  None. FINDINGS: There is a transcervical right femoral neck fracture with varus angulation and external rotation across the fracture line. No other acute fracture is seen of the bony pelvis or proximal left femur. Femoral heads remain normally located. Normal bowel gas pattern. Extensive vascular calcium in the pelvis. Soft tissue swelling of the right hip is noted. IMPRESSION: Transcervical right femoral neck fracture with varus angulation and external rotation across the fracture line. Absence Electronically Signed   By: Kreg Shropshire M.D.   On: 07/21/2019 03:11     Cardiac Studies   Echo 07/22/2019 IMPRESSIONS     1. Left ventricular ejection fraction, by estimation, is 25 to 30%. The  left ventricle has severely decreased function. The left ventrical  demonstrates global hypokinesis. The left ventricular internal cavity size  was moderately dilated. There is mildly   increased left ventricular hypertrophy. indeterminate due to atrial Fib.   2. Right ventricular systolic function is normal. The right ventricular  size is normal.   3. Left atrial size was severely dilated.   4. Right atrial size was moderately dilated.   5. The mitral valve is grossly normal. no evidence of mitral valve  regurgitation. No evidence of mitral stenosis.   6. The aortic valve is grossly normal. Aortic valve regurgitation is not  visualized. No aortic stenosis is present.   7. Evidence of atrial level shunting detected by color flow Doppler.   Patient Profile     86 y.o. male with dementia, CAD, HTN, HLD, diabetes, alcohol abuse, presented with  right hip fracture after an unwitnessed fall at nursing facility. Plan to perform ORIF. He was also found to have pneumonia, pulmonary edema and afib and treated with antibiotic and diuretic Cardiology was consulted for management of A. Fib and pre op evaluation.  Assessment & Plan    1. Preoperative Evaluation for right hip fracture: - increased risk, no way to decrease it   2. New onset afib RVR:  - +rate control - on DVT Lovenox now, not long-term anticoag candidate due to falls, dementia - continue tele  3. Acute HFrEF:  - I/O net -10 L, but feel intake is incomplete (230 cc total yesterday) - EF 25-30% - wt down 18 lbs, but still w/ rales - will recheck CXR, starting to be concerned about aspiration, but already on ABX for PNA - got 60 meq KCl yesterday, give 80 meq today  4. CAD - no ischemic sx - no sig elevation in trop - no change in therapy   For questions or updates, please contact [facility] Please consult [email or web address] for contact info under  [email or web address]/STEMI    Signed, Theodore Demark, PA-C  07/25/2019, 8:15 AM    Patient examined chart reviewed Significant dementia Afib rate reasonable Volume status better continue diuresis CXR To r/o aspiration possible OR tomorrow for right hip fracture  Charlton Haws

## 2019-07-25 NOTE — Progress Notes (Signed)
Cannot administer medication to patient, he is combative swinging his arms and knocking off the facemask.

## 2019-07-25 NOTE — Progress Notes (Addendum)
PROGRESS NOTE    Nicholas TIRADO Sr.  MWU:[id number] DOB: 10-06-1928 DOA: 07/21/2019  PCP: Patient, No Pcp Per    Brief Narrative: Nicholas Overlie Sr. is a 86 y.o. male with history of alcohol abuse previous history of diabetes CAD status post stenting many years ago with history of alcohol abuse who had a compression fracture around 3 to 4 months ago and was admitted at [facility] and subsequently discharged to rehab and has been at this time permanently in a nursing facility due to recurrent falls and dementia prior to which patient used to drink alcohol every day and has not had any alcohol in the last 3 months was brought to the ER after patient had an unwitnessed fall at the nursing facility.  2 days ago as per the patient's son patient was found to be having some shortness of breath and was diagnosed with possible pneumonia.  Was placed on antibiotics.  Patient does not provide good history but does not complain of any chest pain.  Exact same circumstances of the fall is not clear. Patient was scheduled for surgical fixation of fractured hip by Ortho and was noted to have shortness of breath.  Chest x-ray showed worsening pulmonary edema.  Patient was recently treated for pneumonia and chest x-ray finding likely parapneumonic effusion.  He was started on broad-spectrum antibiotics to cover for HCAP.  However, blood culture has shown no growth and unremarkable CRP and procalcitonin.  Vancomycin was discontinued and patient continued on cefepime.  May consider de-escalating/discontinuing antibiotics completely and monitor patient off antibiotics. Today patient appears confused and not oriented x3.  Still has episodes of agitation and on bilateral upper extremity mittens. He is tentatively planned for surgery by Ortho on 07/26/2019   Assessment & Plan:   Principal Problem:   Closed displaced fracture of right femoral neck (HCC) Active Problems:   Acute CHF (congestive heart failure)  (HCC)   New onset atrial fibrillation (HCC)   Hip fracture (HCC)   SIRS (systemic inflammatory response syndrome) (HCC)   Essential hypertension   Hyperlipidemia   Diabetes mellitus with hyperglycemia (HCC)   Urinary retention   Acute hypoxemic respiratory failure (HCC)  Clinical problems list 1.  Closed displaced fracture of right femoral neck 2.  Acute hypoxic respiratory failure 3.  New onset atrial fibrillation 4.  Acute metabolic encephalopathy/alcohol abuse 5.  Sepsis, present on admission 6.  Diabetes mellitus type 2 7.  Essential hypertension 8.  Urinary retention/benign prostatic hyperplasia 9.  Acute heart failure with reduced ejection fraction  1.  Closed displaced fracture of right femoral neck.  Ortho has evaluated and plan for surgical fixation on Thursday but patient developed acute hypoxic  respiratory distress along with suspected bradycardia.  Surgery was rescheduled for 07/26/2019. Continue with pain management.  Judicious use of opiates given alteration in mentation.  2.  Acute hypoxic respiratory failure requiring 2 L O2 by nasal cannula..  Patient is status post antibiotic therapy for pneumonia.  Chest x-ray currently showed bilateral hazy opacities concerning for pleural effusion/pulmonary edema.  Possible parapneumonic effusion.  Patient was initiated on broad-spectrum antibiotics Vanco and cefepime due to concern for HCAP.  Blood culture showed no growth to date and CRP with procalcitonin are unremarkable.  Chest x-ray showed lower lobe opacity and unchanged pleural effusion. I will discontinue vancomycin and continue patient on cefepime for now.  Need to reassess need for antibiotics with/without improvement in clinical status.. Will monitor with serial chest x-ray for possible  loculated effusion and will consult IR for thoracentesis when necessary. Continue with bronchodilators and oxygen supplementation as necessary  3.  New onset atrial fibrillation  CHA2DS2-VASc score =5 Heart rate is controlled on IV metoprolol.  Not anticoagulated due to impending surgery. Continue with DVT prophylaxis with Lovenox.  Cardiology following.  Appreciate cardiology recommendation  4.  Acute metabolic encephalopathy/alcohol abuse.  Metabolic encephalopathy in the setting of ongoing pneumonia/sepsis and history of alcohol abuse-Wernicke encephalopathy Continue to monitor Avoid sedating medications. Judicious use of opiates  5.  Sepsis, likely secondary to pneumonia, present on admission Blood and urine cultures showed no growth to date.  Procalcitonin and CRP are unremarkable.  Repeat chest x-ray showed unchanged pleural effusion with right lower lobe opacity. Will discontinue vancomycin and continue cefepime.   6.  Diabetes mellitus type 2 Continue with sliding scale insulin Fingersticks before meals and at bedtime Hypoglycemic protocol  7.  Essential hypertension Transition IV metoprolol to p.o. Coreg 25 mg twice daily.   8.  Urinary retention/benign prostatic hyperplasia Patient on Foley catheter Continue with tamsulosin.   9.  Acute heart failure with reduced ejection fraction.  NYHA class II-III, ACC/AHA C.  Last echocardiogram was 11/21 showed EF of 25 to 30%. Continue with guideline directed medical therapy-aspirin, statin, beta-blocker and ACEI. Patient may benefit from initiation of Entresto per PARADIGM-HF Fluid restriction to 1500 cc/day Low-sodium/2 g diet Daily weigh.      DVT prophylaxis: Lovenox subacute  Code Status: DNR  Family Communication: None at bedside  Disposition Plan: Patient is from nursing facility and will be discharged back to nursing facility Barriers to discharge include pending surgical intervention with improvement in clinical status-respiratory distress and sepsis   Consultants:   Cardiology  Orthopedics  Procedures: None  Antimicrobials:  07/22/2019 vancomycin and cefepime     Subjective: Patient was seen and examined at bedside.  Patient still appears confused and not oriented x3.  Appeared somnolent, status post IV morphine for pain.  On 2 L O2 by nasal cannula.  Not in acute distress. Patient has bilateral upper extremity mittens.  Objective: Vitals:   07/24/19 2124 07/25/19 0012 07/25/19 0400 07/25/19 0727  BP:  (!) 144/94 (!) 128/99   Pulse: 86  92 81  Resp: (!) 23  12 20   Temp:   (!) 97.5 F (36.4 C) (!) 97.5 F (36.4 C)  TempSrc:   Axillary Axillary  SpO2: 94%  98%   Weight:   83.5 kg   Height:        Intake/Output Summary (Last 24 hours) at 07/25/2019 1248 Last data filed at 07/25/2019 1200 Gross per 24 hour  Intake 250 ml  Output 3825 ml  Net -3575 ml   Filed Weights   07/23/19 0351 07/24/19 0448 07/25/19 0400  Weight: 88.9 kg 87.1 kg 83.5 kg    Examination:  General exam: Patient is somnolent, though arousable.  He is status post IV morphine for pain. Bilateral upper extremity mittens.      Respiratory system: Bilateral mid and lower lung fields rhonchi with some crackles.  On 2 L O2 by nasal cannula. Cardiovascular system: S1 & S2 heard, irregular rhythm.  murmurs is present but no rubs, gallops or clicks.   Gastrointestinal system: Abdomen is nondistended, soft and nontender. No organomegaly or masses felt. Normal bowel sounds heard. Central nervous system: Alert and oriented. No focal neurological deficits. Extremities: Patient moves left lower extremity more, with early motion of the right lower extremity.   . Skin:  No rashes, lesions or ulcers Psychiatry: Patient is confused and not oriented to time, person and place.     Data Reviewed: I have personally reviewed following labs and imaging studies  CBC: Recent Labs  Lab 07/21/19 0239 07/21/19 0239 07/21/19 0609 07/21/19 0609 07/22/19 0343 07/22/19 1529 07/23/19 0357 07/24/19 0411 07/25/19 0353  WBC 17.2*   < > 18.6*   < > 11.6* 11.9* 12.8* 13.0* 13.3*   NEUTROABS 14.5*  --  15.4*  --   --   --   --   --   --   HGB 14.7   < > 14.6   < > 14.4 14.9 14.6 14.9 15.5  HCT 43.4   < > 45.1   < > 42.3 44.9 44.0 44.7 47.2  MCV 96.4   < > 100.9*   < > 95.3 96.4 97.1 96.8 96.9  PLT 200   < > 209   < > 190 182 176 165 152   < > = values in this interval not displayed.   Basic Metabolic Panel: Recent Labs  Lab 07/22/19 0343 07/22/19 1529 07/23/19 0357 07/24/19 0411 07/25/19 0353  NA 138 141 140 144 147*  K 4.0 4.0 3.8 3.0* 3.2*  CL 100 100 100 100 103  CO2 24 27 26 31  33*  GLUCOSE 173* 149* 138* 117* 145*  BUN 28* 34* 37* 36* 36*  CREATININE 1.13 1.16 0.95 0.96 0.94  CALCIUM 9.1 9.3 9.0 8.8* 9.0  MG  --  2.0  --   --  2.0  PHOS  --   --   --   --  3.2   GFR: Estimated Creatinine Clearance: 59.6 mL/min (by C-G formula based on SCr of 0.94 mg/dL). Liver Function Tests: Recent Labs  Lab 07/21/19 0827 07/25/19 0353  AST 27 74*  ALT 14 127*  ALKPHOS 80 91  BILITOT 2.9* 2.0*  PROT 5.9* 5.7*  ALBUMIN 3.5 3.0*   No results for input(s): LIPASE, AMYLASE in the last 168 hours. No results for input(s): AMMONIA in the last 168 hours. Coagulation Profile: Recent Labs  Lab 07/21/19 0239  INR 1.2   Cardiac Enzymes: No results for input(s): CKTOTAL, CKMB, CKMBINDEX, TROPONINI in the last 168 hours. BNP (last 3 results) No results for input(s): PROBNP in the last 8760 hours. HbA1C: No results for input(s): HGBA1C in the last 72 hours. CBG: Recent Labs  Lab 07/24/19 1114 07/24/19 1610 07/24/19 2217 07/25/19 0726 07/25/19 1147  GLUCAP 210* 137* 137* 144* 162*   Lipid Profile: No results for input(s): CHOL, HDL, LDLCALC, TRIG, CHOLHDL, LDLDIRECT in the last 72 hours. Thyroid Function Tests: No results for input(s): TSH, T4TOTAL, FREET4, T3FREE, THYROIDAB in the last 72 hours. Anemia Panel: No results for input(s): VITAMINB12, FOLATE, FERRITIN, TIBC, IRON, RETICCTPCT in the last 72 hours. Sepsis Labs: Recent Labs  Lab  07/21/19 0827  PROCALCITON <0.10    Recent Results (from the past 240 hour(s))  Respiratory Panel by RT PCR (Flu A&B, Covid) - Nasopharyngeal Swab     Status: None   Collection Time: 07/21/19  4:00 AM   Specimen: Nasopharyngeal Swab  Result Value Ref Range Status   SARS Coronavirus 2 by RT PCR NEGATIVE NEGATIVE Final    Comment: (NOTE) SARS-CoV-2 target nucleic acids are NOT DETECTED. The SARS-CoV-2 RNA is generally detectable in upper respiratoy specimens during the acute phase of infection. The lowest concentration of SARS-CoV-2 viral copies this assay can detect is 131 copies/mL. A negative result does  not preclude SARS-Cov-2 infection and should not be used as the sole basis for treatment or other patient management decisions. A negative result may occur with  improper specimen collection/handling, submission of specimen other than nasopharyngeal swab, presence of viral mutation(s) within the areas targeted by this assay, and inadequate number of viral copies (<131 copies/mL). A negative result must be combined with clinical observations, patient history, and epidemiological information. The expected result is Negative. Fact Sheet for Patients:  https://www.moore.com/ Fact Sheet for Healthcare Providers:  https://www.young.biz/ This test is not yet ap proved or cleared by the [place] FDA and  has been authorized for detection and/or diagnosis of SARS-CoV-2 by FDA under an Emergency Use Authorization (EUA). This EUA will remain  in effect (meaning this test can be used) for the duration of the COVID-19 declaration under Section 564(b)(1) of the Act, 21 U.S.C. section 360bbb-3(b)(1), unless the authorization is terminated or revoked sooner.    Influenza A by PCR NEGATIVE NEGATIVE Final   Influenza B by PCR NEGATIVE NEGATIVE Final    Comment: (NOTE) The Xpert Xpress SARS-CoV-2/FLU/RSV assay is intended as an aid in  the diagnosis  of influenza from Nasopharyngeal swab specimens and  should not be used as a sole basis for treatment. Nasal washings and  aspirates are unacceptable for Xpert Xpress SARS-CoV-2/FLU/RSV  testing. Fact Sheet for Patients: https://www.moore.com/ Fact Sheet for Healthcare Providers: https://www.young.biz/ This test is not yet approved or cleared by the [place] FDA and  has been authorized for detection and/or diagnosis of SARS-CoV-2 by  FDA under an Emergency Use Authorization (EUA). This EUA will remain  in effect (meaning this test can be used) for the duration of the  Covid-19 declaration under Section 564(b)(1) of the Act, 21  U.S.C. section 360bbb-3(b)(1), unless the authorization is  terminated or revoked. Performed at [facility], [address], [place], [place] [address]   Surgical PCR screen     Status: Abnormal   Collection Time: 07/22/19  5:24 AM   Specimen: Nasal Mucosa; Nasal Swab  Result Value Ref Range Status   MRSA, PCR POSITIVE (A) NEGATIVE Final    Comment: RESULT CALLED TO, READ BACK BY AND VERIFIED WITH: RN Cheri Guppy [phone] FCP    Staphylococcus aureus POSITIVE (A) NEGATIVE Final    Comment: (NOTE) The Xpert SA Assay (FDA approved for NASAL specimens in patients 39 years of age and older), is one component of a comprehensive surveillance program. It is not intended to diagnose infection nor to guide or monitor treatment. Performed at [facility], [address] [place], [place] [address]   Culture, Urine     Status: None   Collection Time: 07/22/19  2:23 PM   Specimen: Urine, Catheterized  Result Value Ref Range Status   Specimen Description URINE, CATHETERIZED  Final   Special Requests NONE  Final   Culture   Final    NO GROWTH Performed at [facility], [address] [place], [place] [address]    Report Status 07/23/2019 FINAL  Final  Culture, blood (routine x 2)     Status: None  (Preliminary result)   Collection Time: 07/22/19  3:29 PM   Specimen: BLOOD LEFT HAND  Result Value Ref Range Status   Specimen Description BLOOD LEFT HAND  Final   Special Requests   Final    BOTTLES DRAWN AEROBIC ONLY Blood Culture results may not be optimal due to an inadequate volume of blood received  in culture bottles   Culture   Final    NO GROWTH 2 DAYS Performed at [facility], [address], [place], [place] [address]    Report Status PENDING  Incomplete  Culture, blood (routine x 2)     Status: None (Preliminary result)   Collection Time: 07/22/19  3:37 PM   Specimen: BLOOD RIGHT HAND  Result Value Ref Range Status   Specimen Description BLOOD RIGHT HAND  Final   Special Requests   Final    BOTTLES DRAWN AEROBIC ONLY Blood Culture results may not be optimal due to an inadequate volume of blood received in culture bottles   Culture   Final    NO GROWTH 2 DAYS Performed at [facility], [address] [place], [place] [address]    Report Status PENDING  Incomplete         Radiology Studies: DG CHEST PORT 1 VIEW  Result Date: 07/25/2019 CLINICAL DATA:  Dementia and shortness of breath. EXAM: PORTABLE CHEST 1 VIEW COMPARISON:  07/22/2019 FINDINGS: Stable cardiac enlargement. Bilateral hazy lung opacities which are favored to represent posterior layering pleural effusions are unchanged from previous exam. Left lung base retrocardiac opacity is stable. IMPRESSION: No change in bilateral pleural effusions and left lung base opacity. Electronically Signed   By: Signa Kell M.D.   On: 07/25/2019 09:52        Scheduled Meds: . carvedilol  25 mg Oral BID WC  . Chlorhexidine Gluconate Cloth  6 each Topical Q0600  . divalproex  250 mg Oral QHS  . enoxaparin (LOVENOX) injection  40 mg Subcutaneous Q24H  . escitalopram  10 mg Oral QHS  . feeding supplement (ENSURE ENLIVE)  237 mL Oral TID BM  . feeding supplement (PRO-STAT SUGAR FREE 64)  30 mL Oral TID   . furosemide  40 mg Intravenous BID  . insulin aspart  0-5 Units Subcutaneous QHS  . insulin aspart  0-9 Units Subcutaneous TID WC  . ipratropium-albuterol  3 mL Nebulization Q8H  . lidocaine  1 patch Transdermal QHS  . LORazepam  0.5 mg Oral QHS  . memantine  5 mg Oral BID  . multivitamin with minerals  1 tablet Oral Daily  . mupirocin ointment  1 application Nasal BID  . potassium chloride  20 mEq Oral 4x daily   Continuous Infusions: . ceFEPime (MAXIPIME) IV 2 g (07/25/19 0836)  . lactated ringers 10 mL/hr at 07/22/19 1220     LOS: 4 days    Time spent: 35 minutes   Aquilla Hacker, MD [facility] Pager [phone]   If 7PM-7AM, please contact night-coverage [email or web address] Password [facility] 07/25/2019, 12:48 PM

## 2019-07-25 NOTE — Plan of Care (Signed)
  Problem: Health Behavior/Discharge Planning: Goal: Ability to manage health-related needs will improve Outcome: Progressing   Problem: Clinical Measurements: Goal: Ability to maintain clinical measurements within normal limits will improve Outcome: Progressing Goal: Will remain free from infection Outcome: Progressing Goal: Diagnostic test results will improve Outcome: Progressing Goal: Respiratory complications will improve Outcome: Progressing Goal: Cardiovascular complication will be avoided Outcome: Progressing   Problem: Activity: Goal: Risk for activity intolerance will decrease Outcome: Progressing   Problem: Nutrition: Goal: Adequate nutrition will be maintained Outcome: Progressing   Problem: Coping: Goal: Level of anxiety will decrease Outcome: Progressing   Problem: Elimination: Goal: Will not experience complications related to bowel motility Outcome: Progressing Goal: Will not experience complications related to urinary retention Outcome: Progressing   Problem: Pain Managment: Goal: General experience of comfort will improve Outcome: Progressing   Problem: Safety: Goal: Ability to remain free from injury will improve Outcome: Progressing   Problem: Skin Integrity: Goal: Risk for impaired skin integrity will decrease Outcome: Progressing   Problem: Education: Goal: Verbalization of understanding the information provided (i.e., activity precautions, restrictions, etc) will improve Outcome: Progressing Goal: Individualized Educational Video(s) Outcome: Progressing   Problem: Activity: Goal: Ability to ambulate and perform ADLs will improve Outcome: Progressing   Problem: Clinical Measurements: Goal: Postoperative complications will be avoided or minimized Outcome: Progressing   Problem: Self-Concept: Goal: Ability to maintain and perform role responsibilities to the fullest extent possible will improve Outcome: Progressing   Problem: Pain  Management: Goal: Pain level will decrease Outcome: Progressing   

## 2019-07-26 ENCOUNTER — Inpatient Hospital Stay (HOSPITAL_COMMUNITY): Payer: Medicare Other

## 2019-07-26 ENCOUNTER — Encounter (HOSPITAL_COMMUNITY): Payer: Self-pay | Admitting: Internal Medicine

## 2019-07-26 ENCOUNTER — Inpatient Hospital Stay (HOSPITAL_COMMUNITY): Payer: Medicare Other | Admitting: Registered Nurse

## 2019-07-26 ENCOUNTER — Encounter (HOSPITAL_COMMUNITY)
Admission: EM | Disposition: A | Discharge status: Skilled Nursing Facility | Payer: Self-pay | Source: Skilled Nursing Facility | Attending: Student

## 2019-07-26 ENCOUNTER — Other Ambulatory Visit: Payer: Self-pay

## 2019-07-26 DIAGNOSIS — S72001A Fracture of unspecified part of neck of right femur, initial encounter for closed fracture: Secondary | ICD-10-CM

## 2019-07-26 HISTORY — PX: TOTAL HIP ARTHROPLASTY: SHX124

## 2019-07-26 LAB — GLUCOSE, CAPILLARY
Glucose-Capillary: 131 mg/dL — ABNORMAL HIGH (ref 70–99)
Glucose-Capillary: 136 mg/dL — ABNORMAL HIGH (ref 70–99)
Glucose-Capillary: 156 mg/dL — ABNORMAL HIGH (ref 70–99)
Glucose-Capillary: 162 mg/dL — ABNORMAL HIGH (ref 70–99)
Glucose-Capillary: 167 mg/dL — ABNORMAL HIGH (ref 70–99)
Glucose-Capillary: 171 mg/dL — ABNORMAL HIGH (ref 70–99)
Glucose-Capillary: 177 mg/dL — ABNORMAL HIGH (ref 70–99)

## 2019-07-26 LAB — COMPREHENSIVE METABOLIC PANEL
ALT: 82 U/L — ABNORMAL HIGH (ref 0–44)
AST: 33 U/L (ref 15–41)
Albumin: 2.9 g/dL — ABNORMAL LOW (ref 3.5–5.0)
Alkaline Phosphatase: 82 U/L (ref 38–126)
Anion gap: 14 (ref 5–15)
BUN: 42 mg/dL — ABNORMAL HIGH (ref 8–23)
CO2: 32 mmol/L (ref 22–32)
Calcium: 9 mg/dL (ref 8.9–10.3)
Chloride: 106 mmol/L (ref 98–111)
Creatinine, Ser: 1.02 mg/dL (ref 0.61–1.24)
GFR calc Af Amer: >60 mL/min (ref >60)
GFR calc non Af Amer: >60 mL/min (ref >60)
Glucose, Bld: 142 mg/dL — ABNORMAL HIGH (ref 70–99)
Potassium: 3.7 mmol/L (ref 3.5–5.1)
Sodium: 152 mmol/L — ABNORMAL HIGH (ref 135–145)
Total Bilirubin: 2.1 mg/dL — ABNORMAL HIGH (ref 0.3–1.2)
Total Protein: 5.5 g/dL — ABNORMAL LOW (ref 6.5–8.1)

## 2019-07-26 LAB — CBC
HCT: 48.2 % (ref 39.0–52.0)
Hemoglobin: 15.7 g/dL (ref 13.0–17.0)
MCH: 32.4 pg (ref 26.0–34.0)
MCHC: 32.6 g/dL (ref 30.0–36.0)
MCV: 99.6 fL (ref 80.0–100.0)
Platelets: 146 10*3/uL — ABNORMAL LOW (ref 150–400)
RBC: 4.84 MIL/uL (ref 4.22–5.81)
RDW: 14.9 % (ref 11.5–15.5)
WBC: 11.5 10*3/uL — ABNORMAL HIGH (ref 4.0–10.5)
nRBC: 0 % (ref 0.0–0.2)

## 2019-07-26 LAB — SURGICAL PCR SCREEN
MRSA, PCR: NEGATIVE
Staphylococcus aureus: NEGATIVE

## 2019-07-26 LAB — PROCALCITONIN: Procalcitonin: <0.1 ng/mL

## 2019-07-26 LAB — PHOSPHORUS: Phosphorus: 3.3 mg/dL (ref 2.5–4.6)

## 2019-07-26 LAB — MAGNESIUM: Magnesium: 2.2 mg/dL (ref 1.7–2.4)

## 2019-07-26 SURGERY — ARTHROPLASTY, HIP, TOTAL, ANTERIOR APPROACH
Anesthesia: General | Site: Hip | Laterality: Right

## 2019-07-26 MED ORDER — HYDROCODONE-ACETAMINOPHEN 5-325 MG PO TABS: 1.0000 | ORAL_TABLET | Freq: Three times a day (TID) | ORAL | 0 refills | Status: DC | PRN

## 2019-07-26 MED ORDER — LIDOCAINE 2% (20 MG/ML) 5 ML SYRINGE
INTRAMUSCULAR | Status: DC | PRN
  Administered 2019-07-26: 100 mg via INTRAVENOUS

## 2019-07-26 MED ORDER — EPHEDRINE SULFATE-NACL 50-0.9 MG/10ML-% IV SOSY
PREFILLED_SYRINGE | INTRAVENOUS | Status: DC | PRN
  Administered 2019-07-26: 10 mg via INTRAVENOUS

## 2019-07-26 MED ORDER — MEPERIDINE HCL 25 MG/ML IJ SOLN: 6.2500 mg | INTRAMUSCULAR | Status: DC | PRN

## 2019-07-26 MED ORDER — PROPOFOL 10 MG/ML IV BOLUS
INTRAVENOUS | Status: AC
  Filled 2019-07-26: qty 20

## 2019-07-26 MED ORDER — SODIUM CHLORIDE 0.9 % IR SOLN
Status: DC | PRN
  Administered 2019-07-26: 3000 mL

## 2019-07-26 MED ORDER — PROPOFOL 10 MG/ML IV BOLUS
INTRAVENOUS | Status: DC | PRN
  Administered 2019-07-26: 110 mg via INTRAVENOUS

## 2019-07-26 MED ORDER — CEFAZOLIN SODIUM-DEXTROSE 2-4 GM/100ML-% IV SOLN: 2.0000 g | Freq: Four times a day (QID) | INTRAVENOUS | Status: DC

## 2019-07-26 MED ORDER — LIDOCAINE 2% (20 MG/ML) 5 ML SYRINGE
INTRAMUSCULAR | Status: AC
  Filled 2019-07-26: qty 5

## 2019-07-26 MED ORDER — PHENYLEPHRINE HCL-NACL 10-0.9 MG/250ML-% IV SOLN
INTRAVENOUS | Status: DC | PRN
  Administered 2019-07-26: 50 ug/min via INTRAVENOUS

## 2019-07-26 MED ORDER — SUGAMMADEX SODIUM 200 MG/2ML IV SOLN
INTRAVENOUS | Status: DC | PRN
  Administered 2019-07-26: 100 mg via INTRAVENOUS

## 2019-07-26 MED ORDER — HYDROCODONE-ACETAMINOPHEN 7.5-325 MG PO TABS
1.0000 | ORAL_TABLET | ORAL | Status: DC | PRN
  Administered 2019-07-29: 13:00:00 1 via ORAL
  Filled 2019-07-26: qty 1

## 2019-07-26 MED ORDER — METHOCARBAMOL 500 MG PO TABS: 500.0000 mg | ORAL_TABLET | Freq: Four times a day (QID) | ORAL | Status: DC | PRN

## 2019-07-26 MED ORDER — DEXAMETHASONE SODIUM PHOSPHATE 10 MG/ML IJ SOLN
INTRAMUSCULAR | Status: DC | PRN
  Administered 2019-07-26: 5 mg via INTRAVENOUS

## 2019-07-26 MED ORDER — TRANEXAMIC ACID-NACL 1000-0.7 MG/100ML-% IV SOLN
INTRAVENOUS | Status: AC
  Filled 2019-07-26: qty 100

## 2019-07-26 MED ORDER — SUCCINYLCHOLINE CHLORIDE 200 MG/10ML IV SOSY
PREFILLED_SYRINGE | INTRAVENOUS | Status: DC | PRN
  Administered 2019-07-26: 160 mg via INTRAVENOUS

## 2019-07-26 MED ORDER — MORPHINE SULFATE (PF) 2 MG/ML IV SOLN
1.0000 mg | INTRAVENOUS | Status: DC | PRN
  Administered 2019-07-28: 1 mg via INTRAVENOUS
  Filled 2019-07-26: qty 1

## 2019-07-26 MED ORDER — FENTANYL CITRATE (PF) 250 MCG/5ML IJ SOLN
INTRAMUSCULAR | Status: DC | PRN
  Administered 2019-07-26: 100 ug via INTRAVENOUS

## 2019-07-26 MED ORDER — FENTANYL CITRATE (PF) 250 MCG/5ML IJ SOLN
INTRAMUSCULAR | Status: AC
  Filled 2019-07-26: qty 5

## 2019-07-26 MED ORDER — ENOXAPARIN SODIUM 40 MG/0.4ML ~~LOC~~ SOLN
40.0000 mg | SUBCUTANEOUS | Status: DC
  Administered 2019-07-27 – 2019-08-01 (×6): 40 mg via SUBCUTANEOUS
  Filled 2019-07-26 (×6): qty 0.4

## 2019-07-26 MED ORDER — BUPIVACAINE HCL (PF) 0.25 % IJ SOLN
INTRAMUSCULAR | Status: AC
  Filled 2019-07-26: qty 20

## 2019-07-26 MED ORDER — BUPIVACAINE LIPOSOME 1.3 % IJ SUSP
20.0000 mL | INTRAMUSCULAR | Status: AC
  Administered 2019-07-26: 16 mL
  Filled 2019-07-26: qty 20

## 2019-07-26 MED ORDER — ENOXAPARIN SODIUM 40 MG/0.4ML ~~LOC~~ SOLN: 40.0000 mg | Freq: Every day | SUBCUTANEOUS | 13 refills | Status: DC

## 2019-07-26 MED ORDER — PHENYLEPHRINE 40 MCG/ML (10ML) SYRINGE FOR IV PUSH (FOR BLOOD PRESSURE SUPPORT)
PREFILLED_SYRINGE | INTRAVENOUS | Status: DC | PRN
  Administered 2019-07-26: 80 ug via INTRAVENOUS
  Administered 2019-07-26: 120 ug via INTRAVENOUS
  Administered 2019-07-26: 80 ug via INTRAVENOUS
  Administered 2019-07-26: 120 ug via INTRAVENOUS
  Administered 2019-07-26: 80 ug via INTRAVENOUS

## 2019-07-26 MED ORDER — ALBUMIN HUMAN 5 % IV SOLN: INTRAVENOUS | Status: DC | PRN

## 2019-07-26 MED ORDER — SODIUM CHLORIDE 0.9 % IV SOLN: INTRAVENOUS | Status: DC

## 2019-07-26 MED ORDER — HYDROMORPHONE HCL 1 MG/ML IJ SOLN: 0.2500 mg | INTRAMUSCULAR | Status: DC | PRN

## 2019-07-26 MED ORDER — POLYETHYLENE GLYCOL 3350 17 G PO PACK: 17.0000 g | PACK | Freq: Every day | ORAL | Status: DC | PRN

## 2019-07-26 MED ORDER — VASOPRESSIN 20 UNIT/ML IV SOLN
INTRAVENOUS | Status: AC
  Filled 2019-07-26: qty 1

## 2019-07-26 MED ORDER — VANCOMYCIN HCL 1000 MG IV SOLR
INTRAVENOUS | Status: DC | PRN
  Administered 2019-07-26: 1000 mg

## 2019-07-26 MED ORDER — SODIUM CHLORIDE (PF) 0.9 % IJ SOLN
INTRAMUSCULAR | Status: DC | PRN
  Administered 2019-07-26: 16 mL via INTRAVENOUS

## 2019-07-26 MED ORDER — MAGNESIUM CITRATE PO SOLN: 1.0000 | Freq: Once | ORAL | Status: DC | PRN

## 2019-07-26 MED ORDER — ACETAMINOPHEN 325 MG PO TABS
325.0000 mg | ORAL_TABLET | Freq: Four times a day (QID) | ORAL | Status: DC | PRN
  Administered 2019-07-27: 22:00:00 650 mg via ORAL
  Filled 2019-07-26: qty 2

## 2019-07-26 MED ORDER — ACETAMINOPHEN 500 MG PO TABS: 500.0000 mg | ORAL_TABLET | Freq: Four times a day (QID) | ORAL | Status: DC

## 2019-07-26 MED ORDER — EPHEDRINE 5 MG/ML INJ
INTRAVENOUS | Status: AC
  Filled 2019-07-26: qty 10

## 2019-07-26 MED ORDER — PHENYLEPHRINE 40 MCG/ML (10ML) SYRINGE FOR IV PUSH (FOR BLOOD PRESSURE SUPPORT)
PREFILLED_SYRINGE | INTRAVENOUS | Status: AC
  Filled 2019-07-26: qty 20

## 2019-07-26 MED ORDER — ONDANSETRON HCL 4 MG PO TABS: 4.0000 mg | ORAL_TABLET | Freq: Four times a day (QID) | ORAL | Status: DC | PRN

## 2019-07-26 MED ORDER — SUCCINYLCHOLINE CHLORIDE 200 MG/10ML IV SOSY
PREFILLED_SYRINGE | INTRAVENOUS | Status: AC
  Filled 2019-07-26: qty 10

## 2019-07-26 MED ORDER — ONDANSETRON HCL 4 MG/2ML IJ SOLN
INTRAMUSCULAR | Status: AC
  Filled 2019-07-26: qty 2

## 2019-07-26 MED ORDER — ALUM & MAG HYDROXIDE-SIMETH 200-200-20 MG/5ML PO SUSP: 30.0000 mL | ORAL | Status: DC | PRN

## 2019-07-26 MED ORDER — CEFAZOLIN SODIUM-DEXTROSE 2-4 GM/100ML-% IV SOLN
INTRAVENOUS | Status: AC
  Filled 2019-07-26: qty 100

## 2019-07-26 MED ORDER — METHOCARBAMOL 1000 MG/10ML IJ SOLN
500.0000 mg | Freq: Four times a day (QID) | INTRAVENOUS | Status: DC | PRN
  Filled 2019-07-26: qty 5

## 2019-07-26 MED ORDER — PHENOL 1.4 % MT LIQD: 1.0000 | OROMUCOSAL | Status: DC | PRN

## 2019-07-26 MED ORDER — POTASSIUM CHLORIDE 20 MEQ PO PACK
40.0000 meq | PACK | Freq: Every day | ORAL | Status: DC
  Filled 2019-07-26: qty 2

## 2019-07-26 MED ORDER — DEXAMETHASONE SODIUM PHOSPHATE 10 MG/ML IJ SOLN
INTRAMUSCULAR | Status: AC
  Filled 2019-07-26: qty 1

## 2019-07-26 MED ORDER — METOPROLOL TARTRATE 5 MG/5ML IV SOLN
5.0000 mg | Freq: Four times a day (QID) | INTRAVENOUS | Status: DC
  Administered 2019-07-26 – 2019-07-29 (×11): 5 mg via INTRAVENOUS
  Filled 2019-07-26 (×11): qty 5

## 2019-07-26 MED ORDER — ONDANSETRON HCL 4 MG/2ML IJ SOLN: 4.0000 mg | Freq: Four times a day (QID) | INTRAMUSCULAR | Status: DC | PRN

## 2019-07-26 MED ORDER — MENTHOL 3 MG MT LOZG: 1.0000 | LOZENGE | OROMUCOSAL | Status: DC | PRN

## 2019-07-26 MED ORDER — BUPIVACAINE HCL (PF) 0.25 % IJ SOLN
INTRAMUSCULAR | Status: DC | PRN
  Administered 2019-07-26: 16 mL

## 2019-07-26 MED ORDER — HYDROCODONE-ACETAMINOPHEN 5-325 MG PO TABS
1.0000 | ORAL_TABLET | ORAL | Status: DC | PRN
  Filled 2019-07-26: qty 1

## 2019-07-26 MED ORDER — ROCURONIUM BROMIDE 10 MG/ML (PF) SYRINGE
PREFILLED_SYRINGE | INTRAVENOUS | Status: AC
  Filled 2019-07-26: qty 10

## 2019-07-26 MED ORDER — ROCURONIUM BROMIDE 10 MG/ML (PF) SYRINGE
PREFILLED_SYRINGE | INTRAVENOUS | Status: DC | PRN
  Administered 2019-07-26: 30 mg via INTRAVENOUS

## 2019-07-26 MED ORDER — 0.9 % SODIUM CHLORIDE (POUR BTL) OPTIME
TOPICAL | Status: DC | PRN
  Administered 2019-07-26: 1000 mL

## 2019-07-26 MED ORDER — IPRATROPIUM-ALBUTEROL 0.5-2.5 (3) MG/3ML IN SOLN
3.0000 mL | Freq: Two times a day (BID) | RESPIRATORY_TRACT | Status: DC
  Administered 2019-07-26 – 2019-07-27 (×2): 3 mL via RESPIRATORY_TRACT
  Filled 2019-07-26 (×2): qty 3

## 2019-07-26 MED ORDER — SORBITOL 70 % SOLN: 30.0000 mL | Freq: Every day | Status: DC | PRN

## 2019-07-26 MED ORDER — ONDANSETRON HCL 4 MG/2ML IJ SOLN: 4.0000 mg | Freq: Once | INTRAMUSCULAR | Status: DC | PRN

## 2019-07-26 MED ORDER — ONDANSETRON HCL 4 MG/2ML IJ SOLN
INTRAMUSCULAR | Status: DC | PRN
  Administered 2019-07-26: 4 mg via INTRAVENOUS

## 2019-07-26 MED ORDER — DOCUSATE SODIUM 100 MG PO CAPS
100.0000 mg | ORAL_CAPSULE | Freq: Two times a day (BID) | ORAL | Status: DC
  Administered 2019-07-27 – 2019-07-30 (×6): 100 mg via ORAL
  Filled 2019-07-26 (×7): qty 1

## 2019-07-26 MED ORDER — VANCOMYCIN HCL 1000 MG IV SOLR
INTRAVENOUS | Status: AC
  Filled 2019-07-26: qty 1000

## 2019-07-26 MED ORDER — TRANEXAMIC ACID 1000 MG/10ML IV SOLN
2000.0000 mg | INTRAVENOUS | Status: AC
  Administered 2019-07-26: 2000 mg via TOPICAL
  Filled 2019-07-26: qty 20

## 2019-07-26 SURGICAL SUPPLY — 66 items
BAG DECANTER FOR FLEXI CONT (MISCELLANEOUS) ×3 IMPLANT
BIPOLAR AML DEPUY 53 (Hips) ×2 IMPLANT
BIPOLAR AML DEPUY 53MM (Hips) ×1 IMPLANT
CELLS DAT CNTRL 66122 CELL SVR (MISCELLANEOUS) IMPLANT
COVER PERINEAL POST (MISCELLANEOUS) ×3 IMPLANT
COVER SURGICAL LIGHT HANDLE (MISCELLANEOUS) ×3 IMPLANT
COVER WAND RF STERILE (DRAPES) ×3 IMPLANT
DERMABOND ADVANCED (GAUZE/BANDAGES/DRESSINGS) ×2
DERMABOND ADVANCED .7 DNX12 (GAUZE/BANDAGES/DRESSINGS) IMPLANT
DRAPE C-ARM 42X72 X-RAY (DRAPES) ×3 IMPLANT
DRAPE POUCH INSTRU U-SHP 10X18 (DRAPES) ×3 IMPLANT
DRAPE STERI IOBAN 125X83 (DRAPES) ×3 IMPLANT
DRAPE U-SHAPE 47X51 STRL (DRAPES) ×6 IMPLANT
DRSG AQUACEL AG ADV 3.5X10 (GAUZE/BANDAGES/DRESSINGS) ×3 IMPLANT
DURAPREP 26ML APPLICATOR (WOUND CARE) ×6 IMPLANT
ELECT BLADE 4.0 EZ CLEAN MEGAD (MISCELLANEOUS) ×3
ELECT REM PT RETURN 9FT ADLT (ELECTROSURGICAL) ×3
ELECTRODE BLDE 4.0 EZ CLN MEGD (MISCELLANEOUS) ×1 IMPLANT
ELECTRODE REM PT RTRN 9FT ADLT (ELECTROSURGICAL) ×1 IMPLANT
GLOVE BIOGEL PI IND STRL 7.0 (GLOVE) ×1 IMPLANT
GLOVE BIOGEL PI INDICATOR 7.0 (GLOVE) ×2
GLOVE ECLIPSE 7.0 STRL STRAW (GLOVE) ×6 IMPLANT
GLOVE SKINSENSE NS SZ7.5 (GLOVE) ×2
GLOVE SKINSENSE STRL SZ7.5 (GLOVE) ×1 IMPLANT
GLOVE SURG PROTEXIS BL SZ6.5 (GLOVE) ×12 IMPLANT
GLOVE SURG SYN 6.5 PF PI BL (GLOVE) IMPLANT
GLOVE SURG SYN 7.5  E (GLOVE) ×8
GLOVE SURG SYN 7.5 E (GLOVE) ×4 IMPLANT
GLOVE SURG SYN 7.5 PF PI (GLOVE) ×4 IMPLANT
GOWN STRL REIN XL XLG (GOWN DISPOSABLE) ×3 IMPLANT
GOWN STRL REUS W/ TWL LRG LVL3 (GOWN DISPOSABLE) IMPLANT
GOWN STRL REUS W/ TWL XL LVL3 (GOWN DISPOSABLE) ×1 IMPLANT
GOWN STRL REUS W/TWL LRG LVL3 (GOWN DISPOSABLE)
GOWN STRL REUS W/TWL XL LVL3 (GOWN DISPOSABLE) ×2
HANDPIECE INTERPULSE COAX TIP (DISPOSABLE) ×2
HEAD BIPOLAR AML DEPUY 53 (Hips) IMPLANT
HEAD FEM STD 28X+8.5 STRL (Hips) ×2 IMPLANT
HOOD PEEL AWAY FLYTE STAYCOOL (MISCELLANEOUS) ×6 IMPLANT
IV NS IRRIG 3000ML ARTHROMATIC (IV SOLUTION) ×3 IMPLANT
KIT BASIN OR (CUSTOM PROCEDURE TRAY) ×3 IMPLANT
MARKER SKIN DUAL TIP RULER LAB (MISCELLANEOUS) ×3 IMPLANT
NDL SPNL 18GX3.5 QUINCKE PK (NEEDLE) ×1 IMPLANT
NEEDLE SPNL 18GX3.5 QUINCKE PK (NEEDLE) ×3 IMPLANT
PACK TOTAL JOINT (CUSTOM PROCEDURE TRAY) ×3 IMPLANT
PACK UNIVERSAL I (CUSTOM PROCEDURE TRAY) ×3 IMPLANT
RETRACTOR WND ALEXIS 18 MED (MISCELLANEOUS) IMPLANT
RTRCTR WOUND ALEXIS 18CM MED (MISCELLANEOUS)
SAW OSC TIP CART 19.5X105X1.3 (SAW) ×3 IMPLANT
SET HNDPC FAN SPRY TIP SCT (DISPOSABLE) ×1 IMPLANT
STAPLER VISISTAT 35W (STAPLE) IMPLANT
STEM FEMORAL SZ6 HIGH ACTIS (Stem) ×2 IMPLANT
SUT ETHIBOND 2 V 37 (SUTURE) ×3 IMPLANT
SUT ETHILON 2 0 FS 18 (SUTURE) ×4 IMPLANT
SUT VIC AB 0 CT1 27 (SUTURE) ×2
SUT VIC AB 0 CT1 27XBRD ANBCTR (SUTURE) ×1 IMPLANT
SUT VIC AB 1 CTX 36 (SUTURE) ×2
SUT VIC AB 1 CTX36XBRD ANBCTR (SUTURE) ×1 IMPLANT
SUT VIC AB 2-0 CT1 (SUTURE) ×2 IMPLANT
SUT VIC AB 2-0 CT1 27 (SUTURE) ×4
SUT VIC AB 2-0 CT1 TAPERPNT 27 (SUTURE) ×2 IMPLANT
SYR 50ML LL SCALE MARK (SYRINGE) ×3 IMPLANT
TOWEL GREEN STERILE (TOWEL DISPOSABLE) ×3 IMPLANT
TRAY CATH 16FR W/PLASTIC CATH (SET/KITS/TRAYS/PACK) IMPLANT
TRAY FOLEY W/BAG SLVR 16FR (SET/KITS/TRAYS/PACK) ×2
TRAY FOLEY W/BAG SLVR 16FR ST (SET/KITS/TRAYS/PACK) ×1 IMPLANT
YANKAUER SUCT BULB TIP NO VENT (SUCTIONS) ×3 IMPLANT

## 2019-07-26 NOTE — Anesthesia Preprocedure Evaluation (Signed)
Anesthesia Evaluation  Patient identified by MRN, date of birth, ID band Patient awake    Reviewed: Allergy & Precautions, NPO status , Patient's Chart, lab work & pertinent test results  Airway Mallampati: I  TM Distance: >3 FB Neck ROM: Full    Dental   Pulmonary former smoker,    Pulmonary exam normal        Cardiovascular hypertension, Pt. on medications Normal cardiovascular exam     Neuro/Psych Dementia    GI/Hepatic   Endo/Other  diabetes, Type 2  Renal/GU      Musculoskeletal   Abdominal   Peds  Hematology   Anesthesia Other Findings   Reproductive/Obstetrics                             Anesthesia Physical Anesthesia Plan  ASA: III  Anesthesia Plan: General   Post-op Pain Management:    Induction:   PONV Risk Score and Plan: 2 and Ondansetron and Treatment may vary due to age or medical condition  Airway Management Planned: Oral ETT  Additional Equipment:   Intra-op Plan:   Post-operative Plan: Extubation in OR  Informed Consent: I have reviewed the patients History and Physical, chart, labs and discussed the procedure including the risks, benefits and alternatives for the proposed anesthesia with the patient or authorized representative who has indicated his/her understanding and acceptance.       Plan Discussed with: CRNA and Surgeon  Anesthesia Plan Comments:         Anesthesia Quick Evaluation

## 2019-07-26 NOTE — Transfer of Care (Signed)
Immediate Anesthesia Transfer of Care Note  Patient: Nicholas L Heikes Sr.  Procedure(s) Performed: HIP ARTHROPLASTY ANTERIOR APPROACH Hemiarthroplasty (Right Hip)  Patient Location: PACU  Anesthesia Type:General  Level of Consciousness: drowsy  Airway & Oxygen Therapy: Patient Spontanous Breathing and Patient connected to face mask oxygen  Post-op Assessment: Report given to RN and Post -op Vital signs reviewed and stable  Post vital signs: Reviewed and stable  Last Vitals:  Vitals Value Taken Time  BP 134/92 07/26/19 1628  Temp    Pulse 93 07/26/19 1631  Resp 10 07/26/19 1631  SpO2 100 % 07/26/19 1631  Vitals shown include unvalidated device data.  Last Pain:  Vitals:   07/26/19 1130  TempSrc: Axillary  PainSc:          Complications: No apparent anesthesia complications

## 2019-07-26 NOTE — H&P (Signed)
H&P update  The surgical history has been reviewed and remains accurate without interval change.  The patient was re-examined and patient's physiologic condition has not changed significantly in the last 30 days. The condition still exists that makes this procedure necessary. The treatment plan remains the same, without new options for care.  No new pharmacological allergies or types of therapy has been initiated that would change the plan or the appropriateness of the plan.  The patient and/or family understand the potential benefits and risks.  Mayra Reel, MD 07/26/2019 11:12 AM

## 2019-07-26 NOTE — Discharge Instructions (Signed)
° ° °  1. Change dressings as needed °2. May shower but keep incisions covered and dry °3. Take lovenox to prevent blood clots °4. Take stool softeners as needed °5. Take pain meds as needed ° °

## 2019-07-26 NOTE — Anesthesia Procedure Notes (Signed)
Procedure Name: Intubation Date/Time: 07/26/2019 2:34 PM Performed by: Trinna Post., CRNA Pre-anesthesia Checklist: Patient identified, Emergency Drugs available, Suction available, Patient being monitored and Timeout performed Patient Re-evaluated:Patient Re-evaluated prior to induction Oxygen Delivery Method: Circle system utilized Preoxygenation: Pre-oxygenation with 100% oxygen Induction Type: IV induction and Rapid sequence Laryngoscope Size: Mac and 4 Grade View: Grade I Tube type: Oral Tube size: 7.5 mm Number of attempts: 1 Airway Equipment and Method: Stylet Placement Confirmation: ETT inserted through vocal cords under direct vision and breath sounds checked- equal and bilateral Secured at: 23 cm Tube secured with: Tape Dental Injury: Teeth and Oropharynx as per pre-operative assessment

## 2019-07-26 NOTE — Op Note (Signed)
HIP ARTHROPLASTY ANTERIOR APPROACH Hemiarthroplasty  Procedure Note BURTIS IMHOFF Sr.   [id number]  Pre-op Diagnosis: right femoral neck fracture     Post-op Diagnosis: same   Operative Procedures  1. Prosthetic replacement for femoral neck fracture. CPT [phone]  Personnel  Surgeon(s): Tarry Kos, MD  ASSIST: Oneal Grout, PA-C; necessary for the timely completion of procedure and due to complexity of procedure.   Anesthesia: general  Prosthesis: Depuy Femur: Actis 6 HO Head: 53 mm size: +8.5 Bearing Type: bipolar  Hip Hemiarthroplasty (Anterior Approach) Op Note:  After informed consent was obtained and the operative extremity marked in the holding area, the patient was brought back to the operating room and placed supine on the HANA table. Next, the operative extremity was prepped and draped in normal sterile fashion. Surgical timeout occurred verifying patient identification, surgical site, surgical procedure and administration of antibiotics.  A modified anterior Smith-Peterson approach to the hip was performed, using the interval between tensor fascia lata and sartorius.  Dissection was carried bluntly down onto the anterior hip capsule. The lateral femoral circumflex vessels were identified and coagulated. A capsulotomy was performed and the capsular flaps tagged for later repair.  The neck osteotomy was performed. The femoral head was removed and found a 53 mm head was the appropriate fit.    We then turned our attention to the femur.  After placing the femoral hook, the leg was taken to externally rotated, extended and adducted position taking care to perform soft tissue releases to allow for adequate mobilization of the femur. Soft tissue was cleared from the shoulder of the greater trochanter and the hook elevator used to improve exposure of the proximal femur. Sequential broaching performed up to a size 6. Trial neck and head were placed. The leg was brought back up  to neutral and the construct reduced. The position and sizing of components, offset and leg lengths were checked using fluoroscopy. Stability of the construct was checked in extension and external rotation without any subluxation or impingement of prosthesis. We dislocated the prosthesis, dropped the leg back into position, removed trial components, and irrigated copiously. The final stem and head was then placed, the leg brought back up, the system reduced and fluoroscopy used to verify positioning.  We irrigated, obtained hemostasis and closed the capsule using #2 ethibond suture.  The fascia was closed with #1 vicryl plus, the deep fat layer was closed with 0 vicryl, the subcutaneous layers closed with 2.0 Vicryl Plus and the skin closed with staples. A sterile dressing was applied. The patient was awakened in the operating room and taken to recovery in stable condition. All sponge, needle, and instrument counts were correct at the end of the case.   Position: supine  Complications: see description of procedure.  Time Out: performed   Drains/Packing: none  Estimated blood loss: see anesthesia record  Returned to Recovery Room: in good condition.   Antibiotics: yes   Mechanical VTE (DVT) Prophylaxis: sequential compression devices, TED thigh-high  Chemical VTE (DVT) Prophylaxis: lovenox  Fluid Replacement: Crystalloid: see anesthesia record  Specimens Removed: 1 to pathology   Sponge and Instrument Count Correct? yes   PACU: portable radiograph - low AP   Admission: inpatient status, start PT & OT POD#1  Plan/RTC: Return in 2 weeks for staple removal. Return in 6 weeks to see MD.  Weight Bearing/Load Lower Extremity: full  Hip precautions: none  N. Glee Arvin, MD [facility] 3:51 PM   Implant  Name Type Inv. Item Serial No. Manufacturer Lot No. LRB No. Used Action  BIPOLAR AML DEPUY 53MM - [id number] Hips BIPOLAR AML DEPUY 53MM  DEPUY SYNTHES [id number] Right 1 Implanted    STEM FEMORAL SZ6 HIGH ACTIS - [id number] Stem STEM FEMORAL SZ6 HIGH ACTIS  [employer] DE[employer] SPINE [id number] Right 1 Implanted  HIP BALL ARTICU DEPUY - [id number] Hips HIP BALL ARTICU DEPUY  DEPUY SYNTHES [id number] Right 1 Implanted

## 2019-07-26 NOTE — Progress Notes (Addendum)
Progress Note  Patient Name: Nicholas STANDISH Sr. Date of Encounter: 07/26/2019  Primary Cardiologist: Quay Burow, MD   Subjective   Laying in bed, opens eyes to verbal stimuli.   Inpatient Medications    Scheduled Meds: . carvedilol  25 mg Oral BID WC  . Chlorhexidine Gluconate Cloth  6 each Topical Q0600  . divalproex  250 mg Oral QHS  . escitalopram  10 mg Oral QHS  . feeding supplement (ENSURE ENLIVE)  237 mL Oral TID BM  . feeding supplement (PRO-STAT SUGAR FREE 64)  30 mL Oral TID  . furosemide  40 mg Intravenous BID  . insulin aspart  0-5 Units Subcutaneous QHS  . insulin aspart  0-9 Units Subcutaneous TID WC  . ipratropium-albuterol  3 mL Nebulization Q8H  . lidocaine  1 patch Transdermal QHS  . LORazepam  0.5 mg Oral QHS  . memantine  5 mg Oral BID  . multivitamin with minerals  1 tablet Oral Daily  . mupirocin ointment  1 application Nasal BID  . potassium chloride  20 mEq Oral 4x daily   Continuous Infusions: . ceFEPime (MAXIPIME) IV 2 g (07/25/19 2138)  . lactated ringers 10 mL/hr at 07/22/19 1220   PRN Meds: LORazepam, morphine injection   Vital Signs    Vitals:   07/25/19 2333 07/26/19 0338 07/26/19 0732 07/26/19 0742  BP: 110/75 140/82  (!) 141/96  Pulse: 93 94 (!) 103 (!) 108  Resp: (!) 23 (!) 21 15 18   Temp: 98.6 F (37 C) 98.6 F (37 C)  (!) 97.5 F (36.4 C)  TempSrc: Axillary Axillary  Axillary  SpO2: 93% 91% 92% 95%  Weight:  78.9 kg    Height:        Intake/Output Summary (Last 24 hours) at 07/26/2019 0929 Last data filed at 07/25/2019 2330 Gross per 24 hour  Intake 111 ml  Output 1050 ml  Net -939 ml   Last 3 Weights 07/26/2019 07/25/2019 07/24/2019  Weight (lbs) 174 lb 184 lb 192 lb  Weight (kg) 78.926 kg 83.462 kg 87.091 kg      Telemetry    Afib rates 100s - Personally Reviewed  ECG    No new tracing.  Physical Exam  Older WM, laying flat in bed. GEN: No acute distress.   Neck: No JVD Cardiac: Irreg, Irreg, no  murmurs, rubs, or gallops.  Respiratory: Clear to auscultation bilaterally. GI: Soft, nontender, non-distended  MS: No edema; No deformity. Neuro:  Nonfocal  Psych: Normal affect   Labs    High Sensitivity Troponin:   Recent Labs  Lab 07/21/19 0239 07/21/19 0827 07/22/19 1529 07/22/19 1800  TROPONINIHS 29* 32* 92* 88*      Chemistry Recent Labs  Lab 07/21/19 0827 07/21/19 1140 07/24/19 0411 07/25/19 0353 07/26/19 0334  NA 138   < > 144 147* 152*  K 5.0   < > 3.0* 3.2* 3.7  CL 99   < > 100 103 106  CO2 26   < > 31 33* 32  GLUCOSE 150*   < > 117* 145* 142*  BUN 18   < > 36* 36* 42*  CREATININE 0.98   < > 0.96 0.94 1.02  CALCIUM 8.6*   < > 8.8* 9.0 9.0  PROT 5.9*  --   --  5.7* 5.5*  ALBUMIN 3.5  --   --  3.0* 2.9*  AST 27  --   --  74* 33  ALT 14  --   --  127* 82*  ALKPHOS 80  --   --  91 82  BILITOT 2.9*  --   --  2.0* 2.1*  GFRNONAA >60   < > >60 >60 >60  GFRAA >60   < > >60 >60 >60  ANIONGAP 13   < > 13 11 14    < > = values in this interval not displayed.     Hematology Recent Labs  Lab 07/24/19 0411 07/25/19 0353 07/26/19 0334  WBC 13.0* 13.3* 11.5*  RBC 4.62 4.87 4.84  HGB 14.9 15.5 15.7  HCT 44.7 47.2 48.2  MCV 96.8 96.9 99.6  MCH 32.3 31.8 32.4  MCHC 33.3 32.8 32.6  RDW 14.6 14.6 14.9  PLT 165 152 146*    BNP Recent Labs  Lab 07/21/19 0239  BNP 655.7*     DDimer No results for input(s): DDIMER in the last 168 hours.   Radiology    DG CHEST PORT 1 VIEW  Result Date: 07/25/2019 CLINICAL DATA:  Dementia and shortness of breath. EXAM: PORTABLE CHEST 1 VIEW COMPARISON:  07/22/2019 FINDINGS: Stable cardiac enlargement. Bilateral hazy lung opacities which are favored to represent posterior layering pleural effusions are unchanged from previous exam. Left lung base retrocardiac opacity is stable. IMPRESSION: No change in bilateral pleural effusions and left lung base opacity. Electronically Signed   By: 09/19/2019 M.D.   On: 07/25/2019  09:52    Cardiac Studies   TTE: 07/22/19  IMPRESSIONS    1. Left ventricular ejection fraction, by estimation, is 25 to 30%. The  left ventricle has severely decreased function. The left ventrical  demonstrates global hypokinesis. The left ventricular internal cavity size  was moderately dilated. There is mildly  increased left ventricular hypertrophy. indeterminate due to atrial Fib.  2. Right ventricular systolic function is normal. The right ventricular  size is normal.  3. Left atrial size was severely dilated.  4. Right atrial size was moderately dilated.  5. The mitral valve is grossly normal. no evidence of mitral valve  regurgitation. No evidence of mitral stenosis.  6. The aortic valve is grossly normal. Aortic valve regurgitation is not  visualized. No aortic stenosis is present.  7. Evidence of atrial level shunting detected by color flow Doppler.   Patient Profile     86 y.o. male dementia, CAD, HTN, HLD, diabetes, alcohol abuse, presented with right hip fracture after an unwitnessed fall at nursing facility. Plan to perform ORIF. He was also found to have pneumonia, pulmonary edema and afib and treated with antibiotic and diuretic Cardiology was consulted for management of A. Fib and pre op evaluation.  Assessment & Plan    1. Preop evaluation for right hip fracture: planned for ORIF today with ortho. Given his risk factors he is at increased risk, but unable to further optimize.   2. Afib: rates are elevated at times. He does not appear to be taking orals. Will switch BB back to metoprolol IV 5mg  q6hr for rate control. No plans for long term OAC given hx of freq falls.   3. Acute systolic HF: EF noted at 25-30% with global hypokinesis. Currently on BB therapy, switched to IV this morning. Ideally would prefer coreg/Toprol XL if he resumes PO meds. Cr stable, therefore consider ACE/ARB. -- being diuresed with IV lasix 40mg  BID, net -12.3L. Weight trending  down -- on K+ supplement  4. PNA: Initially placed on antibiotics, switched to cefepime.   For questions or updates, please contact [facility] Please consult  [email or web address] for contact info under        Signed, Laverda Page, NP  07/26/2019, 9:29 AM    I have personally seen and examined this patient. I agree with the assessment and plan as outlined above. He is diuresing well with IV Lasix. He does not seem to be volume overloaded today. Plan for ORIF of right hip fracture today. High risk for surgery as outlined in previous notes. IV metoprolol for rate control of atrial fib. No plans for long term oral anti-coagulation.   Verne Carrow 07/26/2019 10:47 AM

## 2019-07-26 NOTE — Progress Notes (Signed)
PROGRESS NOTE    Nicholas PINES Sr.  NPY:[id number] DOB: 06/23/1928 DOA: 07/21/2019  PCP: Patient, No Pcp Per    Brief Narrative: Nicholas Overlie Sr. is a 86 y.o. male with history of alcohol abuse previous history of diabetes CAD status post stenting many years ago with history of alcohol abuse who had a compression fracture around 3 to 4 months ago and was admitted at [facility] and subsequently discharged to rehab and has been at this time permanently in a nursing facility due to recurrent falls and dementia prior to which patient used to drink alcohol every day and has not had any alcohol in the last 3 months was brought to the ER after patient had an unwitnessed fall at the nursing facility.  2 days ago as per the patient's son patient was found to be having some shortness of breath and was diagnosed with possible pneumonia.  Was placed on antibiotics.  Patient does not provide good history but does not complain of any chest pain.  Exact same circumstances of the fall is not clear. Patient was scheduled for surgical fixation of fractured hip by Ortho and was noted to have shortness of breath.  Chest x-ray showed worsening pulmonary edema.  Patient was recently treated for pneumonia and chest x-ray finding likely parapneumonic effusion.  He was started on broad-spectrum antibiotics to cover for HCAP.  However, blood culture has shown no growth and unremarkable CRP and procalcitonin.  Vancomycin was discontinued and patient continued on cefepime.  May consider de-escalating/discontinuing antibiotics completely and monitor patient off antibiotics. Today patient appears confused and not oriented x3.  Still has episodes of agitation and on bilateral upper extremity mittens. He is tentatively planned for surgery by Ortho on 07/26/2019   Assessment & Plan:   Principal Problem:   Closed displaced fracture of right femoral neck (HCC) Active Problems:   Acute CHF (congestive heart failure)  (HCC)   New onset atrial fibrillation (HCC)   Hip fracture (HCC)   SIRS (systemic inflammatory response syndrome) (HCC)   Essential hypertension   Hyperlipidemia   Diabetes mellitus with hyperglycemia (HCC)   Urinary retention   Acute hypoxemic respiratory failure (HCC)  Clinical problems list 1.  Closed displaced fracture of right femoral neck 2.  Acute hypoxic respiratory failure 3.  New onset atrial fibrillation 4.  Acute metabolic encephalopathy/alcohol abuse 5.  Sepsis, present on admission 6.  Diabetes mellitus type 2 7.  Essential hypertension 8.  Urinary retention/benign prostatic hyperplasia 9.  Acute heart failure with reduced ejection fraction  1.  Closed displaced fracture of right femoral neck.  Ortho has evaluated and planned for surgical fixation on last Thursday but patient developed acute hypoxic  respiratory distress along with suspected bradycardia.  Surgery was rescheduled for 07/26/2019. Continue with pain management.  Judicious use of opiates given alteration in mentation.  He is still significantly confused so not sure if Ortho still plans to do surgery today.  2.  Acute hypoxic respiratory failure requiring 2 L O2 by nasal cannula..  Patient is status post antibiotic therapy for pneumonia.  Chest x-ray currently showed bilateral hazy opacities concerning for pleural effusion/pulmonary edema.  Possible parapneumonic effusion.  Patient was initiated on broad-spectrum antibiotics Vanco and cefepime due to concern for HCAP.  Blood culture showed no growth to date and CRP with procalcitonin are unremarkable.  Chest x-ray showed lower lobe opacity and unchanged pleural effusion.  Vancomycin was discontinued on 07/25/2019 and cefepime was continued.  Continue with  bronchodilators.  Currently on 2 L of nasal can oxygen.  Wondering if this is only pleural effusion which is noninfectious.  Will check procalcitonin 1 more time and if unremarkable, will give a trial of discontinuing  antibiotics.  3.  New onset persistent atrial fibrillation CHA2DS2-VASc score =5 Heart rate is poorly controlled.  Not taking orally due to altered mental status.  Beta-blocker switched to IV Lopressor 5 mg every 6 hours.  No anticoagulation due to frequent falls and impending surgery. Continue with DVT prophylaxis with Lovenox.  Cardiology following.  Appreciate cardiology recommendation  4.  Acute metabolic encephalopathy/alcohol abuse.  Metabolic encephalopathy in the setting of ongoing pneumonia/sepsis and history of alcohol abuse-Wernicke encephalopathy.  Delirium could possibly be part of this.  Avoid sharing medications.  Continue to monitor and redirect.  Delirium precautions.  Treat underlying cause.  5.  Sepsis, likely secondary to pneumonia, present on admission Blood and urine cultures showed no growth to date.  Procalcitonin and CRP are unremarkable.  Repeat chest x-ray showed unchanged pleural effusion with right lower lobe opacity.  Continues to have leukocytosis but improving.  Remains afebrile.  Will recheck procalcitonin today.  6.  Diabetes mellitus type 2: Blood sugar control.  Continue SSI.  7.  Essential hypertension Transition IV metoprolol to p.o. Coreg 25 mg twice daily.   8.  Urinary retention/benign prostatic hyperplasia Patient on Foley catheter Continue with tamsulosin.   9.  Acute systolic congestive heart failure.  NYHA class II-III, ACC/AHA C.  Last echocardiogram was 11/21 showed EF of 25 to 30%. Fluid restriction to 1500 cc/day Low-sodium/2 g diet Daily weigh. Cardiology on board.  He is on Lasix 40 mg IV twice daily.  Beta-blocker switched to IV due to his inability to take p.o.  DVT prophylaxis: Lovenox subacute  Code Status: DNR  Family Communication: None at bedside .  Called and spoke to his son Fayrene Fearing.  Fayrene Fearing was upset that patient was taken to the surgery without him being informed about this. Disposition Plan: Patient is from nursing facility and  will be discharged back to nursing facility Barriers to discharge include pending surgical intervention with improvement in clinical status-respiratory distress and sepsis   Consultants:   Cardiology  Orthopedics  Procedures: None  Antimicrobials:  07/22/2019 vancomycin and cefepime    Subjective: Patient seen and examined.  He was completely confused and was trying to get out of bed but was not significantly agitated.  Objective: Vitals:   07/26/19 0338 07/26/19 0732 07/26/19 0742 07/26/19 1130  BP: 140/82  (!) 141/96 (!) 145/93  Pulse: 94 (!) 103 (!) 108 83  Resp: (!) 21 15 18 16   Temp: 98.6 F (37 C)  (!) 97.5 F (36.4 C) (!) 97.4 F (36.3 C)  TempSrc: Axillary  Axillary Axillary  SpO2: 91% 92% 95% 94%  Weight: 78.9 kg     Height:        Intake/Output Summary (Last 24 hours) at 07/26/2019 1330 Last data filed at 07/25/2019 2330 Gross per 24 hour  Intake 111 ml  Output 650 ml  Net -539 ml   Filed Weights   07/24/19 0448 07/25/19 0400 07/26/19 0338  Weight: 87.1 kg 83.5 kg 78.9 kg    Examination:  General exam: Appears confused Respiratory system: Diminished breath sounds at the bases bilaterally Cardiovascular system: S1 & S2 heard, RRR. No JVD, murmurs, rubs, gallops or clicks. No pedal edema. Gastrointestinal system: Abdomen is nondistended, soft and nontender. No organomegaly or masses felt. Normal bowel  sounds heard. Central nervous system: Disoriented Extremities: Symmetric 5 x 5 power.  Data Reviewed: I have personally reviewed following labs and imaging studies  CBC: Recent Labs  Lab 07/21/19 0239 07/21/19 0239 07/21/19 0609 07/22/19 0343 07/22/19 1529 07/23/19 0357 07/24/19 0411 07/25/19 0353 07/26/19 0334  WBC 17.2*   < > 18.6*   < > 11.9* 12.8* 13.0* 13.3* 11.5*  NEUTROABS 14.5*  --  15.4*  --   --   --   --   --   --   HGB 14.7   < > 14.6   < > 14.9 14.6 14.9 15.5 15.7  HCT 43.4   < > 45.1   < > 44.9 44.0 44.7 47.2 48.2  MCV 96.4   <  > 100.9*   < > 96.4 97.1 96.8 96.9 99.6  PLT 200   < > 209   < > 182 176 165 152 146*   < > = values in this interval not displayed.   Basic Metabolic Panel: Recent Labs  Lab 07/22/19 1529 07/23/19 0357 07/24/19 0411 07/25/19 0353 07/26/19 0334  NA 141 140 144 147* 152*  K 4.0 3.8 3.0* 3.2* 3.7  CL 100 100 100 103 106  CO2 27 26 31  33* 32  GLUCOSE 149* 138* 117* 145* 142*  BUN 34* 37* 36* 36* 42*  CREATININE 1.16 0.95 0.96 0.94 1.02  CALCIUM 9.3 9.0 8.8* 9.0 9.0  MG 2.0  --   --  2.0 2.2  PHOS  --   --   --  3.2 3.3   GFR: Estimated Creatinine Clearance: 54.9 mL/min (by C-G formula based on SCr of 1.02 mg/dL). Liver Function Tests: Recent Labs  Lab 07/21/19 0827 07/25/19 0353 07/26/19 0334  AST 27 74* 33  ALT 14 127* 82*  ALKPHOS 80 91 82  BILITOT 2.9* 2.0* 2.1*  PROT 5.9* 5.7* 5.5*  ALBUMIN 3.5 3.0* 2.9*   No results for input(s): LIPASE, AMYLASE in the last 168 hours. No results for input(s): AMMONIA in the last 168 hours. Coagulation Profile: Recent Labs  Lab 07/21/19 0239  INR 1.2   Cardiac Enzymes: No results for input(s): CKTOTAL, CKMB, CKMBINDEX, TROPONINI in the last 168 hours. BNP (last 3 results) No results for input(s): PROBNP in the last 8760 hours. HbA1C: No results for input(s): HGBA1C in the last 72 hours. CBG: Recent Labs  Lab 07/25/19 1940 07/25/19 2330 07/26/19 0333 07/26/19 0739 07/26/19 1133  GLUCAP 241* 169* 136* 177* 156*   Lipid Profile: No results for input(s): CHOL, HDL, LDLCALC, TRIG, CHOLHDL, LDLDIRECT in the last 72 hours. Thyroid Function Tests: No results for input(s): TSH, T4TOTAL, FREET4, T3FREE, THYROIDAB in the last 72 hours. Anemia Panel: No results for input(s): VITAMINB12, FOLATE, FERRITIN, TIBC, IRON, RETICCTPCT in the last 72 hours. Sepsis Labs: Recent Labs  Lab 07/21/19 0827  PROCALCITON <0.10    Recent Results (from the past 240 hour(s))  Respiratory Panel by RT PCR (Flu A&B, Covid) - Nasopharyngeal  Swab     Status: None   Collection Time: 07/21/19  4:00 AM   Specimen: Nasopharyngeal Swab  Result Value Ref Range Status   SARS Coronavirus 2 by RT PCR NEGATIVE NEGATIVE Final    Comment: (NOTE) SARS-CoV-2 target nucleic acids are NOT DETECTED. The SARS-CoV-2 RNA is generally detectable in upper respiratoy specimens during the acute phase of infection. The lowest concentration of SARS-CoV-2 viral copies this assay can detect is 131 copies/mL. A negative result does not preclude SARS-Cov-2 infection  and should not be used as the sole basis for treatment or other patient management decisions. A negative result may occur with  improper specimen collection/handling, submission of specimen other than nasopharyngeal swab, presence of viral mutation(s) within the areas targeted by this assay, and inadequate number of viral copies (<131 copies/mL). A negative result must be combined with clinical observations, patient history, and epidemiological information. The expected result is Negative. Fact Sheet for Patients:  https://www.moore.com/ Fact Sheet for Healthcare Providers:  https://www.young.biz/ This test is not yet ap proved or cleared by the [place] FDA and  has been authorized for detection and/or diagnosis of SARS-CoV-2 by FDA under an Emergency Use Authorization (EUA). This EUA will remain  in effect (meaning this test can be used) for the duration of the COVID-19 declaration under Section 564(b)(1) of the Act, 21 U.S.C. section 360bbb-3(b)(1), unless the authorization is terminated or revoked sooner.    Influenza A by PCR NEGATIVE NEGATIVE Final   Influenza B by PCR NEGATIVE NEGATIVE Final    Comment: (NOTE) The Xpert Xpress SARS-CoV-2/FLU/RSV assay is intended as an aid in  the diagnosis of influenza from Nasopharyngeal swab specimens and  should not be used as a sole basis for treatment. Nasal washings and  aspirates are  unacceptable for Xpert Xpress SARS-CoV-2/FLU/RSV  testing. Fact Sheet for Patients: https://www.moore.com/ Fact Sheet for Healthcare Providers: https://www.young.biz/ This test is not yet approved or cleared by the [place] FDA and  has been authorized for detection and/or diagnosis of SARS-CoV-2 by  FDA under an Emergency Use Authorization (EUA). This EUA will remain  in effect (meaning this test can be used) for the duration of the  Covid-19 declaration under Section 564(b)(1) of the Act, 21  U.S.C. section 360bbb-3(b)(1), unless the authorization is  terminated or revoked. Performed at [facility], [address], [place], [place] [address]   Surgical PCR screen     Status: Abnormal   Collection Time: 07/22/19  5:24 AM   Specimen: Nasal Mucosa; Nasal Swab  Result Value Ref Range Status   MRSA, PCR POSITIVE (A) NEGATIVE Final    Comment: RESULT CALLED TO, READ BACK BY AND VERIFIED WITH: RN Cheri Guppy [phone] FCP    Staphylococcus aureus POSITIVE (A) NEGATIVE Final    Comment: (NOTE) The Xpert SA Assay (FDA approved for NASAL specimens in patients 30 years of age and older), is one component of a comprehensive surveillance program. It is not intended to diagnose infection nor to guide or monitor treatment. Performed at [facility], [address] [place], [place] [address]   Culture, Urine     Status: None   Collection Time: 07/22/19  2:23 PM   Specimen: Urine, Catheterized  Result Value Ref Range Status   Specimen Description URINE, CATHETERIZED  Final   Special Requests NONE  Final   Culture   Final    NO GROWTH Performed at [facility], [address], [place], [place] [address]    Report Status 07/23/2019 FINAL  Final  Culture, blood (routine x 2)     Status: None (Preliminary result)   Collection Time: 07/22/19  3:29 PM   Specimen: BLOOD LEFT HAND  Result Value Ref Range Status   Specimen  Description BLOOD LEFT HAND  Final   Special Requests   Final    BOTTLES DRAWN AEROBIC ONLY Blood Culture results may not be optimal due to an inadequate volume of blood received in culture bottles  Culture   Final    NO GROWTH 4 DAYS Performed at [facility], [address] [place], [place] [address]    Report Status PENDING  Incomplete  Culture, blood (routine x 2)     Status: None (Preliminary result)   Collection Time: 07/22/19  3:37 PM   Specimen: BLOOD RIGHT HAND  Result Value Ref Range Status   Specimen Description BLOOD RIGHT HAND  Final   Special Requests   Final    BOTTLES DRAWN AEROBIC ONLY Blood Culture results may not be optimal due to an inadequate volume of blood received in culture bottles   Culture   Final    NO GROWTH 4 DAYS Performed at [facility], [address] [place], [place] [address]    Report Status PENDING  Incomplete         Radiology Studies: DG CHEST PORT 1 VIEW  Result Date: 07/25/2019 CLINICAL DATA:  Dementia and shortness of breath. EXAM: PORTABLE CHEST 1 VIEW COMPARISON:  07/22/2019 FINDINGS: Stable cardiac enlargement. Bilateral hazy lung opacities which are favored to represent posterior layering pleural effusions are unchanged from previous exam. Left lung base retrocardiac opacity is stable. IMPRESSION: No change in bilateral pleural effusions and left lung base opacity. Electronically Signed   By: Signa Kell M.D.   On: 07/25/2019 09:52        Scheduled Meds: . bupivacaine liposome  20 mL Infiltration To OR  . Chlorhexidine Gluconate Cloth  6 each Topical Q0600  . feeding supplement (ENSURE ENLIVE)  237 mL Oral TID BM  . feeding supplement (PRO-STAT SUGAR FREE 64)  30 mL Oral TID  . furosemide  40 mg Intravenous BID  . insulin aspart  0-5 Units Subcutaneous QHS  . insulin aspart  0-9 Units Subcutaneous TID WC  . ipratropium-albuterol  3 mL Nebulization BID  . lidocaine  1 patch Transdermal QHS  . metoprolol  tartrate  5 mg Intravenous Q6H  . mupirocin ointment  1 application Nasal BID  . tranexamic acid (CYKLOKAPRON) topical - INTRAOP  2,000 mg Topical To OR   Continuous Infusions: . ceFEPime (MAXIPIME) IV 2 g (07/26/19 1034)  . lactated ringers 10 mL/hr at 07/22/19 1220     LOS: 5 days    Time spent: 35 minutes   Hughie Closs, MD [facility] Pager [phone]   If 7PM-7AM, please contact night-coverage [email or web address] Password [email or web address] 07/26/2019, 1:30 PM

## 2019-07-26 NOTE — Anesthesia Postprocedure Evaluation (Signed)
Anesthesia Post Note  Patient: Nicholas L Correa Sr.  Procedure(s) Performed: HIP ARTHROPLASTY ANTERIOR APPROACH Hemiarthroplasty (Right Hip)     Patient location during evaluation: PACU Anesthesia Type: General Level of consciousness: awake and alert Pain management: pain level controlled Vital Signs Assessment: post-procedure vital signs reviewed and stable Respiratory status: spontaneous breathing, nonlabored ventilation, respiratory function stable and patient connected to nasal cannula oxygen Cardiovascular status: blood pressure returned to baseline and stable Postop Assessment: no apparent nausea or vomiting Anesthetic complications: no    Last Vitals:  Vitals:   07/26/19 1715 07/26/19 1805  BP: 123/89 129/84  Pulse: 95 93  Resp: 16 16  Temp: 36.6 C 36.5 C  SpO2: 100% 100%    Last Pain:  Vitals:   07/26/19 1805  TempSrc: Axillary  PainSc:                  Cecile Hearing

## 2019-07-27 ENCOUNTER — Encounter: Payer: Self-pay | Admitting: *Deleted

## 2019-07-27 LAB — BASIC METABOLIC PANEL
Anion gap: 13 (ref 5–15)
BUN: 37 mg/dL — ABNORMAL HIGH (ref 8–23)
CO2: 32 mmol/L (ref 22–32)
Calcium: 8.8 mg/dL — ABNORMAL LOW (ref 8.9–10.3)
Chloride: 106 mmol/L (ref 98–111)
Creatinine, Ser: 0.8 mg/dL (ref 0.61–1.24)
GFR calc Af Amer: >60 mL/min (ref >60)
GFR calc non Af Amer: >60 mL/min (ref >60)
Glucose, Bld: 177 mg/dL — ABNORMAL HIGH (ref 70–99)
Potassium: 3.5 mmol/L (ref 3.5–5.1)
Sodium: 151 mmol/L — ABNORMAL HIGH (ref 135–145)

## 2019-07-27 LAB — CBC
HCT: 46.3 % (ref 39.0–52.0)
Hemoglobin: 15.1 g/dL (ref 13.0–17.0)
MCH: 32.8 pg (ref 26.0–34.0)
MCHC: 32.6 g/dL (ref 30.0–36.0)
MCV: 100.4 fL — ABNORMAL HIGH (ref 80.0–100.0)
Platelets: 114 10*3/uL — ABNORMAL LOW (ref 150–400)
RBC: 4.61 MIL/uL (ref 4.22–5.81)
RDW: 14.6 % (ref 11.5–15.5)
WBC: 14.5 10*3/uL — ABNORMAL HIGH (ref 4.0–10.5)
nRBC: 0 % (ref 0.0–0.2)

## 2019-07-27 LAB — CULTURE, BLOOD (ROUTINE X 2)
Culture: NO GROWTH
Culture: NO GROWTH

## 2019-07-27 LAB — GLUCOSE, CAPILLARY
Glucose-Capillary: 174 mg/dL — ABNORMAL HIGH (ref 70–99)
Glucose-Capillary: 135 mg/dL — ABNORMAL HIGH (ref 70–99)
Glucose-Capillary: 169 mg/dL — ABNORMAL HIGH (ref 70–99)
Glucose-Capillary: 118 mg/dL — ABNORMAL HIGH (ref 70–99)
Glucose-Capillary: 208 mg/dL — ABNORMAL HIGH (ref 70–99)

## 2019-07-27 LAB — SARS CORONAVIRUS 2 (TAT 6-24 HRS): SARS Coronavirus 2: NEGATIVE

## 2019-07-27 MED ORDER — CHLORHEXIDINE GLUCONATE CLOTH 2 % EX PADS
6.0000 | MEDICATED_PAD | Freq: Every day | CUTANEOUS | Status: DC
  Administered 2019-07-27 – 2019-07-28 (×2): 6 via TOPICAL

## 2019-07-27 MED ORDER — ACETAMINOPHEN 650 MG RE SUPP
650.0000 mg | Freq: Four times a day (QID) | RECTAL | Status: DC
  Administered 2019-07-27: 02:00:00 650 mg via RECTAL
  Filled 2019-07-27 (×2): qty 1

## 2019-07-27 MED ORDER — IPRATROPIUM-ALBUTEROL 0.5-2.5 (3) MG/3ML IN SOLN: 3.0000 mL | RESPIRATORY_TRACT | Status: DC | PRN

## 2019-07-27 NOTE — Evaluation (Signed)
Occupational Therapy Evaluation Patient Details Name: Nicholas DONATH Sr. MRN: [id number] DOB: [birth year] Today's Date: 07/27/2019    History of Present Illness Nicholas L Eisenberg Sr. is a 86 y.o. male with history of alcohol abuse previous history of diabetes CAD status post stenting many years ago with history of alcohol abuse who had a compression fracture and has been in nursing facility since.  He had an unwitnessed fall at the nursing facility admitted with R FNF.  Had pulmonary edema and new onset a-fib w/RVR on admission 2/10 so had medical treatment prior to surgery.  Now s/p R hip hemiarthroplasty anterior approach on 2/15.   Clinical Impression   Patient admitted for above and limited by problem list below, including impaired communication and cognition, decreased activity tolerance. Evaluation limited to bed level, patient not following commands consistently (randomly lifting arms after asked, and able to squeeze with L hand once), difficult to understand due to mumbled speech.  Patient able to wash face with hand over hand assist to initiate and attend to task, but perseverates on washing face and cannot transition to washing hands. Requires max-total assist for all self care at this time. Per chart review patient from SNF, unable to provide PLOF.  Will follow acutely, recommend continued OT services while admitted and after dc at SNF level at dc.     Follow Up Recommendations  SNF;Supervision/Assistance - 24 hour    Equipment Recommendations  None recommended by OT    Recommendations for Other Services       Precautions / Restrictions Precautions Precautions: Fall Restrictions Weight Bearing Restrictions: Yes RLE Weight Bearing: Weight bearing as tolerated      Mobility Bed Mobility Overal bed mobility: Needs Assistance Bed Mobility: Supine to Sit;Sit to Supine     Supine to sit: Max assist;+2 for physical assistance;HOB elevated Sit to supine: Max assist;+2 for physical  assistance   General bed mobility comments: remains supine in bed, +2 not available   Transfers Overall transfer level: Needs assistance               General transfer comment: deferred     Balance Overall balance assessment: Needs assistance Sitting-balance support: Feet supported Sitting balance-Leahy Scale: Poor Sitting balance - Comments: min to mod A for sitting balance, at times trying to lay back on bed; assist for keeping arm still for BP measuremeen                                   ADL either performed or assessed with clinical judgement   ADL Overall ADL's : Needs assistance/impaired     Grooming: Maximal assistance;Bed level Grooming Details (indicate cue type and reason): max assist to initate and sustain attention to task, when asked to wash hands perseverates on washing face                             Functional mobility during ADLs: (bed level eval ) General ADL Comments: total assist for all self care at this time      Vision         Perception     Praxis      Pertinent Vitals/Pain Pain Assessment: Faces Pain Score: 0-No pain Faces Pain Scale: No hurt Pain Location: R hip with movement Pain Descriptors / Indicators: Grimacing;Moaning;Discomfort Pain Intervention(s): Monitored during session;Repositioned     Hand Dominance  Extremity/Trunk Assessment Upper Extremity Assessment Upper Extremity Assessment: Generalized weakness;Difficult to assess due to impaired cognition RUE Deficits / Details: AAROM grossly WFL, unable to test strength, but lifts arms antigravity LUE Deficits / Details: AAROM grossly WFL, unable to test strength, but lifts arms antigravity   Lower Extremity Assessment Lower Extremity Assessment: Defer to PT evaluation RLE Deficits / Details: AAROM limited with pt reaching to touch hip with assisted knee/hip flexion   Cervical / Trunk Assessment Cervical / Trunk Assessment: Kyphotic    Communication Communication Communication: Expressive difficulties(mumbling )   Cognition Arousal/Alertness: Awake/alert Behavior During Therapy: Restless Overall Cognitive Status: History of cognitive impairments - at baseline Area of Impairment: Following commands                       Following Commands: Follows one step commands inconsistently       General Comments: patient unable to follow commands, attend to tasks; known hx of dementia    General Comments  RN made aware pt not following commands, unable to participate in OOB transfer and mitts reapplied    Exercises Total Joint Exercises Ankle Circles/Pumps: AAROM;5 reps;Supine;Both Heel Slides: AAROM;5 reps;Supine;Both   Shoulder Instructions      Home Living Family/patient expects to be discharged to:: Skilled nursing facility                                 Additional Comments: in Memory Care portion at [facility]      Prior Functioning/Environment Level of Independence: Needs assistance        Comments: no information about prior functional status, pt unable to report        OT Problem List: Decreased strength;Decreased activity tolerance;Impaired balance (sitting and/or standing);Decreased coordination;Decreased cognition;Decreased safety awareness;Decreased knowledge of use of DME or AE;Decreased knowledge of precautions;Cardiopulmonary status limiting activity;Pain      OT Treatment/Interventions: Self-care/ADL training;DME and/or AE instruction;Energy conservation;Therapeutic activities;Balance training;Patient/family education;Cognitive remediation/compensation;Therapeutic exercise    OT Goals(Current goals can be found in the care plan section) Acute Rehab OT Goals Patient Stated Goal: none stated Time For Goal Achievement: 08/10/19 Potential to Achieve Goals: Fair  OT Frequency: Min 2X/week   Barriers to D/C:            Co-evaluation              AM-PAC OT "6  Clicks" Daily Activity     Outcome Measure Help from another person eating meals?: Total Help from another person taking care of personal grooming?: A Lot Help from another person toileting, which includes using toliet, bedpan, or urinal?: Total Help from another person bathing (including washing, rinsing, drying)?: Total Help from another person to put on and taking off regular upper body clothing?: Total Help from another person to put on and taking off regular lower body clothing?: Total 6 Click Score: 7   End of Session Equipment Utilized During Treatment: Oxygen Nurse Communication: Mobility status  Activity Tolerance: Other (comment)(limited by cognition) Patient left: in bed;with call bell/phone within reach;with bed alarm set;with restraints reapplied  OT Visit Diagnosis: Other abnormalities of gait and mobility (R26.89);Muscle weakness (generalized) (M62.81);Pain;Other symptoms and signs involving cognitive function;Cognitive communication deficit (R41.841) Pain - Right/Left: Right Pain - part of body: Hip                Time: 1350-1405 OT Time Calculation (min): 15 min Charges:  OT General Charges $OT Visit:  1 Visit OT Evaluation $OT Eval Moderate Complexity: 1 Mod  Barry Brunner, OT Acute Rehabilitation Services Pager [phone] Office [phone]    Chancy Milroy 07/27/2019, 3:12 PM

## 2019-07-27 NOTE — Plan of Care (Signed)
  Problem: Health Behavior/Discharge Planning: Goal: Ability to manage health-related needs will improve Outcome: Progressing   Problem: Clinical Measurements: Goal: Ability to maintain clinical measurements within normal limits will improve Outcome: Progressing Goal: Will remain free from infection Outcome: Progressing Goal: Diagnostic test results will improve Outcome: Progressing Goal: Respiratory complications will improve Outcome: Progressing Goal: Cardiovascular complication will be avoided Outcome: Progressing   Problem: Activity: Goal: Risk for activity intolerance will decrease Outcome: Progressing   Problem: Nutrition: Goal: Adequate nutrition will be maintained Outcome: Progressing   Problem: Coping: Goal: Level of anxiety will decrease Outcome: Progressing   Problem: Elimination: Goal: Will not experience complications related to bowel motility Outcome: Progressing Goal: Will not experience complications related to urinary retention Outcome: Progressing   Problem: Pain Managment: Goal: General experience of comfort will improve Outcome: Progressing   Problem: Safety: Goal: Ability to remain free from injury will improve Outcome: Progressing   Problem: Skin Integrity: Goal: Risk for impaired skin integrity will decrease Outcome: Progressing   Problem: Education: Goal: Verbalization of understanding the information provided (i.e., activity precautions, restrictions, etc) will improve Outcome: Progressing Goal: Individualized Educational Video(s) Outcome: Progressing   Problem: Activity: Goal: Ability to ambulate and perform ADLs will improve Outcome: Progressing   Problem: Clinical Measurements: Goal: Postoperative complications will be avoided or minimized Outcome: Progressing   Problem: Self-Concept: Goal: Ability to maintain and perform role responsibilities to the fullest extent possible will improve Outcome: Progressing   Problem: Pain  Management: Goal: Pain level will decrease Outcome: Progressing   

## 2019-07-27 NOTE — Progress Notes (Addendum)
Progress Note  Patient Name: Nicholas SOFFER Sr. Date of Encounter: 07/27/2019  Primary Cardiologist: Nanetta Batty, MD   Subjective   Laying in bed quietly. Getting a breathing Tx.   Inpatient Medications    Scheduled Meds: . Chlorhexidine Gluconate Cloth  6 each Topical Daily  . docusate sodium  100 mg Oral BID  . enoxaparin (LOVENOX) injection  40 mg Subcutaneous Q24H  . feeding supplement (ENSURE ENLIVE)  237 mL Oral TID BM  . feeding supplement (PRO-STAT SUGAR FREE 64)  30 mL Oral TID  . furosemide  40 mg Intravenous BID  . insulin aspart  0-5 Units Subcutaneous QHS  . insulin aspart  0-9 Units Subcutaneous TID WC  . lidocaine  1 patch Transdermal QHS  . metoprolol tartrate  5 mg Intravenous Q6H  . mupirocin ointment  1 application Nasal BID   Continuous Infusions: . sodium chloride 75 mL/hr at 07/27/19 0117  . ceFEPime (MAXIPIME) IV 2 g (07/26/19 2228)  . lactated ringers 10 mL/hr at 07/26/19 1405  . methocarbamol (ROBAXIN) IV     PRN Meds: acetaminophen, alum & mag hydroxide-simeth, HYDROcodone-acetaminophen, HYDROcodone-acetaminophen, ipratropium-albuterol, LORazepam, magnesium citrate, menthol-cetylpyridinium **OR** phenol, methocarbamol **OR** methocarbamol (ROBAXIN) IV, morphine injection, ondansetron **OR** ondansetron (ZOFRAN) IV, polyethylene glycol, sorbitol   Vital Signs    Vitals:   07/27/19 0406 07/27/19 0408 07/27/19 0754 07/27/19 0809  BP: 133/82  131/87 115/87  Pulse: 97   84  Resp: (!) 23 11 15  (!) 23  Temp: 97.7 F (36.5 C)  97.9 F (36.6 C)   TempSrc: Oral  Axillary   SpO2: 100%   98%  Weight: 82.6 kg     Height:        Intake/Output Summary (Last 24 hours) at 07/27/2019 0813 Last data filed at 07/27/2019 07/29/2019 Gross per 24 hour  Intake 1300 ml  Output 1205 ml  Net 95 ml   Last 3 Weights 07/27/2019 07/26/2019 07/25/2019  Weight (lbs) 182 lb 174 lb 184 lb  Weight (kg) 82.555 kg 78.926 kg 83.462 kg      Telemetry    Afib rates  improved - Personally Reviewed  ECG    No new tracing this morning.  Physical Exam  Older WM, confused at baseline. Laying in bed comfortable.  GEN: No acute distress.   Neck: No JVD Cardiac: Irreg Irreg, no murmurs, rubs, or gallops.  Respiratory: Clear to auscultation bilaterally. GI: Soft, nontender, non-distended  MS: No edema; No deformity. Neuro:  Nonfocal  Psych: Normal affect   Labs    High Sensitivity Troponin:   Recent Labs  Lab 07/21/19 0239 07/21/19 0827 07/22/19 1529 07/22/19 1800  TROPONINIHS 29* 32* 92* 88*      Chemistry Recent Labs  Lab 07/21/19 0827 07/21/19 1140 07/25/19 0353 07/26/19 0334 07/27/19 0447  NA 138   < > 147* 152* 151*  K 5.0   < > 3.2* 3.7 3.5  CL 99   < > 103 106 106  CO2 26   < > 33* 32 32  GLUCOSE 150*   < > 145* 142* 177*  BUN 18   < > 36* 42* 37*  CREATININE 0.98   < > 0.94 1.02 0.80  CALCIUM 8.6*   < > 9.0 9.0 8.8*  PROT 5.9*  --  5.7* 5.5*  --   ALBUMIN 3.5  --  3.0* 2.9*  --   AST 27  --  74* 33  --   ALT 14  --  127* 82*  --   ALKPHOS 80  --  91 82  --   BILITOT 2.9*  --  2.0* 2.1*  --   GFRNONAA >60   < > >60 >60 >60  GFRAA >60   < > >60 >60 >60  ANIONGAP 13   < > 11 14 13    < > = values in this interval not displayed.     Hematology Recent Labs  Lab 07/25/19 0353 07/26/19 0334 07/27/19 0447  WBC 13.3* 11.5* 14.5*  RBC 4.87 4.84 4.61  HGB 15.5 15.7 15.1  HCT 47.2 48.2 46.3  MCV 96.9 99.6 100.4*  MCH 31.8 32.4 32.8  MCHC 32.8 32.6 32.6  RDW 14.6 14.9 14.6  PLT 152 146* 114*    BNP Recent Labs  Lab 07/21/19 0239  BNP 655.7*     DDimer No results for input(s): DDIMER in the last 168 hours.   Radiology    Pelvis Portable  Result Date: 07/26/2019 CLINICAL DATA:  Right hip prosthesis. EXAM: PORTABLE PELVIS 1-2 VIEWS COMPARISON:  Intraoperative spot films, same date. FINDINGS: The bipolar hip prosthesis appears to be in good position without complicating features. No periprosthetic fracture. The  left hip is intact. Advanced vascular calcifications. IMPRESSION: Bipolar right hip prosthesis in good position without complicating features. Electronically Signed   By: 07/28/2019 M.D.   On: 07/26/2019 16:49   DG CHEST PORT 1 VIEW  Result Date: 07/25/2019 CLINICAL DATA:  Dementia and shortness of breath. EXAM: PORTABLE CHEST 1 VIEW COMPARISON:  07/22/2019 FINDINGS: Stable cardiac enlargement. Bilateral hazy lung opacities which are favored to represent posterior layering pleural effusions are unchanged from previous exam. Left lung base retrocardiac opacity is stable. IMPRESSION: No change in bilateral pleural effusions and left lung base opacity. Electronically Signed   By: 09/19/2019 M.D.   On: 07/25/2019 09:52   DG C-Arm 1-60 Min  Result Date: 07/26/2019 CLINICAL DATA:  Right hip hemiarthroplasty. EXAM: OPERATIVE right HIP (WITH PELVIS IF PERFORMED) 2 VIEWS TECHNIQUE: Fluoroscopic spot image(s) were submitted for interpretation post-operatively. COMPARISON:  None. FINDINGS: Intraoperative fluoro spot images are submitted. Right hip hemiarthroplasty is noted. No fractures are present. IMPRESSION: Right hip hemiarthroplasty without radiographic evidence for complication. Electronically Signed   By: 07/28/2019 M.D.   On: 07/26/2019 16:51   DG HIP OPERATIVE UNILAT W OR W/O PELVIS RIGHT  Result Date: 07/26/2019 CLINICAL DATA:  Right hip hemiarthroplasty. EXAM: OPERATIVE right HIP (WITH PELVIS IF PERFORMED) 2 VIEWS TECHNIQUE: Fluoroscopic spot image(s) were submitted for interpretation post-operatively. COMPARISON:  None. FINDINGS: Intraoperative fluoro spot images are submitted. Right hip hemiarthroplasty is noted. No fractures are present. IMPRESSION: Right hip hemiarthroplasty without radiographic evidence for complication. Electronically Signed   By: 07/28/2019 M.D.   On: 07/26/2019 16:51    Cardiac Studies   TTE: 07/22/19  IMPRESSIONS    1. Left ventricular  ejection fraction, by estimation, is 25 to 30%. The  left ventricle has severely decreased function. The left ventrical  demonstrates global hypokinesis. The left ventricular internal cavity size  was moderately dilated. There is mildly  increased left ventricular hypertrophy. indeterminate due to atrial Fib.  2. Right ventricular systolic function is normal. The right ventricular  size is normal.  3. Left atrial size was severely dilated.  4. Right atrial size was moderately dilated.  5. The mitral valve is grossly normal. no evidence of mitral valve  regurgitation. No evidence of mitral stenosis.  6. The aortic valve is grossly  normal. Aortic valve regurgitation is not  visualized. No aortic stenosis is present.  7. Evidence of atrial level shunting detected by color flow Doppler.   Patient Profile     86 y.o. male with PMH of dementia, CAD, HTN, HLD, diabetes, alcohol abuse,presented with right hip fracture after an unwitnessed fall at nursing facility. Plan to perform ORIF. He was also found to have pneumonia, pulmonary edema and afib and treated with antibiotic and diuretic Cardiology was consulted for management of A. Fib and pre op evaluation.  Assessment & Plan    1. Preop evaluation for right hip fracture: underwent ORIF with ortho yesterday and tolerated well.   2. Afib: better controlled today. Remains on metoprolol IV 5mg  q6hr for rate control. Would switch to PO once he is tolerating. Would favor coreg or Toprol given his LV dysfunction. No plans for long term [facility] given hx of freq falls.   3. Acute systolic HF: EF noted at 96-04% with global hypokinesis. Currently on IV BB therapy. Ideally would prefer coreg/Toprol XL if he resumes PO meds. Cr stable, therefore consider ACE/ARB. -- being diuresed with IV lasix 40mg  BID, net -12.3L. Weight appears stable.  -- will stop post op IV fluids with his need for diuresis.  -- on K+ supplement  4. PNA: Initially placed on  antibiotics, switched to cefepime. Management per primary  For questions or updates, please contact [place] Please consult [email or web address] for contact info under   Signed, Reino Bellis, NP  07/27/2019, 8:13 AM    I have personally seen and examined this patient. I agree with the assessment and plan as outlined above.  Stable this am. Rates controlled. Agree with continue IV beta blocker until taking po medications. He does not appear to be volume overloaded. Negative 12.2 liters since admission. ? Need for IV Lasix.   We will sign off for now. Please call with questions.   Lauree Chandler 07/27/2019 8:46 AM

## 2019-07-27 NOTE — Evaluation (Signed)
Physical Therapy Evaluation Patient Details Name: Nicholas ENRIQUES Sr. MRN: [id number] DOB: 15-Sep-1928 Today's Date: 07/27/2019   History of Present Illness  Nicholas L Wiechmann Sr. is a 86 y.o. male with history of alcohol abuse previous history of diabetes CAD status post stenting many years ago with history of alcohol abuse who had a compression fracture and has been in nursing facility since.  He had an unwitnessed fall at the nursing facility admitted with R FNF.  Had pulmonary edema and new onset a-fib w/RVR on admission 2/10 so had medical treatment prior to surgery.  Now s/p R hip hemiarthroplasty anterior approach on 2/15.  Clinical Impression  Patient presents with mobility limited due to pain, weakness, but most of all decreased cognition and unable to follow commands this session to participate with OOB transfer.  Feel he will benefit from skilled PT in the acute setting hopeful to progress to skilled in patient rehab at SNF upon d/c.     Follow Up Recommendations SNF;Supervision/Assistance - 24 hour    Equipment Recommendations  None recommended by PT    Recommendations for Other Services       Precautions / Restrictions Precautions Precautions: Fall Restrictions RLE Weight Bearing: Weight bearing as tolerated      Mobility  Bed Mobility Overal bed mobility: Needs Assistance Bed Mobility: Supine to Sit;Sit to Supine     Supine to sit: Max assist;+2 for physical assistance;HOB elevated Sit to supine: Max assist;+2 for physical assistance   General bed mobility comments: slowly tried to get pt to engage assisting to bring legs off bed, scoot hips, then he did pull up trunk with +2A to sit EOB; to supine assist for legs and trunk  Transfers Overall transfer level: Needs assistance               General transfer comment: assisted to attempt to stand with Stedy x 2 but pt not initiating despite max cues and assist so returned to supine  Ambulation/Gait              General Gait Details: unable  Stairs            Wheelchair Mobility    Modified Rankin (Stroke Patients Only)       Balance Overall balance assessment: Needs assistance Sitting-balance support: Feet supported Sitting balance-Leahy Scale: Poor Sitting balance - Comments: min to mod A for sitting balance, at times trying to lay back on bed; assist for keeping arm still for BP measuremeen                                     Pertinent Vitals/Pain Pain Assessment: Faces Pain Score: 0-No pain Faces Pain Scale: Hurts even more Pain Location: R hip with movement Pain Descriptors / Indicators: Grimacing;Moaning;Discomfort Pain Intervention(s): Monitored during session;Repositioned    Home Living Family/patient expects to be discharged to:: Skilled nursing facility                 Additional Comments: in [place] [facility] at [facility]    Prior Function Level of Independence: Needs assistance         Comments: no information about prior functional status     Hand Dominance        Extremity/Trunk Assessment   Upper Extremity Assessment Upper Extremity Assessment: Difficult to assess due to impaired cognition;RUE deficits/detail;LUE deficits/detail RUE Deficits / Details: AAROM grossly WFL, unable to test  strength, but lifts arms antigravity LUE Deficits / Details: AAROM grossly WFL, unable to test strength, but lifts arms antigravity    Lower Extremity Assessment Lower Extremity Assessment: RLE deficits/detail;Difficult to assess due to impaired cognition RLE Deficits / Details: AAROM limited with pt reaching to touch hip with assisted knee/hip flexion    Cervical / Trunk Assessment Cervical / Trunk Assessment: Kyphotic  Communication   Communication: Expressive difficulties(mumbling)  Cognition Arousal/Alertness: Awake/alert Behavior During Therapy: Restless Overall Cognitive Status: No family/caregiver present to determine  baseline cognitive functioning                                 General Comments: not following one step commands, unable to maintain attention more than few seconds, needs max cues to attend even momentarily      General Comments General comments (skin integrity, edema, etc.): RN made aware pt not following commands, unable to participate in OOB transfer and mitts reapplied    Exercises Total Joint Exercises Ankle Circles/Pumps: AAROM;5 reps;Supine;Both Heel Slides: AAROM;5 reps;Supine;Both   Assessment/Plan    PT Assessment Patient needs continued PT services  PT Problem List Decreased strength;Decreased activity tolerance;Decreased mobility;Decreased safety awareness;Decreased balance;Decreased knowledge of use of DME       PT Treatment Interventions DME instruction;Therapeutic activities;Balance training;Patient/family education;Therapeutic exercise;Functional mobility training    PT Goals (Current goals can be found in the Care Plan section)  Acute Rehab PT Goals Patient Stated Goal: none stated PT Goal Formulation: Patient unable to participate in goal setting Time For Goal Achievement: 08/10/19 Potential to Achieve Goals: Fair    Frequency Min 3X/week   Barriers to discharge        Co-evaluation               AM-PAC PT "6 Clicks" Mobility  Outcome Measure Help needed turning from your back to your side while in a flat bed without using bedrails?: Total Help needed moving from lying on your back to sitting on the side of a flat bed without using bedrails?: Total Help needed moving to and from a bed to a chair (including a wheelchair)?: Total Help needed standing up from a chair using your arms (e.g., wheelchair or bedside chair)?: Total Help needed to walk in hospital room?: Total Help needed climbing 3-5 steps with a railing? : Total 6 Click Score: 6    End of Session Equipment Utilized During Treatment: Gait belt Activity Tolerance:  Treatment limited secondary to agitation Patient left: in bed;with call bell/phone within reach;with bed alarm set Nurse Communication: Mobility status PT Visit Diagnosis: Other abnormalities of gait and mobility (R26.89);History of falling (Z91.81);Muscle weakness (generalized) (M62.81)    Time: 8786-7672 PT Time Calculation (min) (ACUTE ONLY): 29 min   Charges:   PT Evaluation $PT Eval Moderate Complexity: 1 Mod PT Treatments $Therapeutic Activity: 8-22 mins        Sheran Lawless, [facility] [phone] 07/27/2019   Elray Mcgregor 07/27/2019, 1:47 PM

## 2019-07-27 NOTE — TOC Progression Note (Signed)
Transition of Care (TOC) - Progression Note    Patient Details  Name: Nicholas ROMMEL Sr. MRN: [id number] Date of Birth: 04-10-1929  [facility]) CM/SW Contact  Nonda Lou, [place] Phone Number: [phone] 07/27/2019, 2:37 PM  Clinical Narrative:    CSW contacted [facility] [phone] and spoke with patient's social worker Grayce Sessions. CSW informed Gigi Gin that patient will be likely be discharging in the next 24-48hrs, per doctor. CSW confirmed patient is able to return to the [facility] side of [facility]. CSW informed social worker of PT recommendation and verified appropriate documentation will be sent.  CSW contacted patient's son Fayrene Fearing [phone] and informed him that patient may be discharged in the next few days and that  [facility] has been contacted to confirm patient can return. CSW verified transport will be completed by non-emergency transport. CSW informed him that he will be contacted prior to patient discharge.   CSW requested a new covid test prior to patient discharge. CSW will continue to follow and assist with discharge needs.   Expected Discharge Plan: Assisted Living Barriers to Discharge: Continued Medical Work up  Expected Discharge Plan and Services Expected Discharge Plan: Assisted Living       Living arrangements for the past 2 months: Assisted Living Facility([facility])                                       Social Determinants of Health (SDOH) Interventions    Readmission Risk Interventions No flowsheet data found.

## 2019-07-27 NOTE — Progress Notes (Signed)
Subjective: 1 Day Post-Op Procedure(s) (LRB): HIP ARTHROPLASTY ANTERIOR APPROACH Hemiarthroplasty (Right) Patient reports pain as mild.    Objective: Vital signs in last 24 hours: Temp:  [97.4 F (36.3 C)-97.9 F (36.6 C)] 97.9 F (36.6 C) (02/16 0754) Pulse Rate:  [45-98] 97 (02/16 0406) Resp:  [11-23] 15 (02/16 0754) BP: (123-145)/(82-93) 131/87 (02/16 0754) SpO2:  [94 %-100 %] 100 % (02/16 0406) Weight:  [82.6 kg] 82.6 kg (02/16 0406)  Intake/Output from previous day: 02/15 0701 - 02/16 0700 In: 1300 [I.V.:700; IV Piggyback:600] Out: 1205 [Urine:1105; Blood:100] Intake/Output this shift: No intake/output data recorded.  Recent Labs    07/25/19 0353 07/26/19 0334 07/27/19 0447  HGB 15.5 15.7 15.1   Recent Labs    07/26/19 0334 07/27/19 0447  WBC 11.5* 14.5*  RBC 4.84 4.61  HCT 48.2 46.3  PLT 146* 114*   Recent Labs    07/26/19 0334 07/27/19 0447  NA 152* 151*  K 3.7 3.5  CL 106 106  CO2 32 32  BUN 42* 37*  CREATININE 1.02 0.80  GLUCOSE 142* 177*  CALCIUM 9.0 8.8*   No results for input(s): LABPT, INR in the last 72 hours.  Neurovascular intact Intact pulses distally Incision: dressing C/D/I No cellulitis present Compartment soft   Assessment/Plan: 1 Day Post-Op Procedure(s) (LRB): HIP ARTHROPLASTY ANTERIOR APPROACH Hemiarthroplasty (Right) Up with therapy  WBAT RLE DVT ppx- Lovenox 40 daily x 2 weeks F/u with Dr. Roda Shutters 2 weeks post-op      Nicholas Trujillo 07/27/2019, 8:09 AM

## 2019-07-27 NOTE — Progress Notes (Addendum)
PROGRESS NOTE    Nicholas LIPARI Sr.  BJS:[id number] DOB: [birth year] DOA: 07/21/2019  PCP: Patient, No Pcp Per    Brief Narrative: Nicholas Overlie Sr. is a 86 y.o. male with history of alcohol abuse previous history of diabetes CAD status post stenting many years ago with history of alcohol abuse who had a compression fracture around 3 to 4 months ago and was admitted at [facility] and subsequently discharged to rehab and has been at this time permanently in a nursing facility due to recurrent falls and dementia prior to which patient used to drink alcohol every day and has not had any alcohol in the last 3 months was brought to the ER after patient had an unwitnessed fall at the nursing facility.  2 days ago as per the patient's son patient was found to be having some shortness of breath and was diagnosed with possible pneumonia.  Was placed on antibiotics.  Patient does not provide good history but does not complain of any chest pain.  Exact same circumstances of the fall is not clear. Patient was scheduled for surgical fixation of fractured hip by Ortho and was noted to have shortness of breath.  Chest x-ray showed worsening pulmonary edema.  Patient was recently treated for pneumonia and chest x-ray finding likely parapneumonic effusion.  He was started on broad-spectrum antibiotics to cover for HCAP.  However, blood culture has shown no growth and unremarkable CRP and procalcitonin.  Initially initially vancomycin was discontinued and patient continued on cefepime.  Repeated procalcitonin yesterday was also unremarkable.  Completed 5 days of cefepime.  Does not seem to have pneumonia, instead may have pulmonary edema.  Discontinued cefepime today.  He underwent surgical hip repair yesterday by orthopedics.    Assessment & Plan:   Principal Problem:   Closed displaced fracture of right femoral neck (HCC) Active Problems:   Acute CHF (congestive heart failure) (HCC)   New onset atrial  fibrillation (HCC)   Hip fracture (HCC)   SIRS (systemic inflammatory response syndrome) (HCC)   Essential hypertension   Hyperlipidemia   Diabetes mellitus with hyperglycemia (HCC)   Urinary retention   Acute hypoxemic respiratory failure (HCC)  Clinical problems list 1.  Closed displaced fracture of right femoral neck 2.  Acute hypoxic respiratory failure 3.  New onset atrial fibrillation 4.  Acute metabolic encephalopathy/alcohol abuse 5.  Sepsis, present on admission 6.  Diabetes mellitus type 2 7.  Essential hypertension 8.  Urinary retention/benign prostatic hyperplasia 9.  Acute heart failure with reduced ejection fraction  1.  Closed displaced fracture of right femoral neck.   Ortho had evaluated and planned for surgical fixation on last Thursday but patient developed acute hypoxic  respiratory distress along with suspected bradycardia.  Surgery was rescheduled for 07/26/2019.  Now status post prosthetic replacement for femoral neck fracture on 07/26/2019.   2.  Acute hypoxic respiratory failure requiring 2 L O2 by nasal cannula..  Patient is status post antibiotic therapy for pneumonia, cefepime for 5 days.  Chest x-ray currently showed bilateral hazy opacities concerning for pleural effusion/pulmonary edema. Patient was initially initiated on broad-spectrum antibiotics Vanco and cefepime due to concern for HCAP.  Blood culture showed no growth to date and CRP with procalcitonin are unremarkable.  Chest x-ray showed lower lobe opacity and unchanged pleural effusion.  Vancomycin was discontinued on 07/25/2019 and cefepime was discontinued today.  Continue with bronchodilators.  Currently on 2 L of nasal can oxygen.  Wondering if this is  only pleural effusion which is noninfectious.  He continues to be on Lasix IV twice daily per cardiology.  3.  New onset persistent atrial fibrillation CHA2DS2-VASc score =5 Heart rate is poorly controlled.  Not taking orally due to altered mental  status.  Beta-blocker switched to IV Lopressor 5 mg every 6 hours.  No anticoagulation due to frequent falls and impending surgery. Continue with DVT prophylaxis with Lovenox.  Cardiology recommends switching to Toprol-XL or Coreg once he is able to take p.o.  He has been started on dysphagia 1 diet today.  We will continue IV Lopressor for today and reassess tomorrow.  4.  Acute metabolic encephalopathy/alcohol abuse.  Metabolic encephalopathy in the setting of ongoing pneumonia/sepsis and history of alcohol abuse-Wernicke encephalopathy.  Delirium could possibly be part of this.  Avoid sedating medications.  Continue to monitor and redirect.  Delirium precautions.  Treat underlying cause.  After speaking to his son yesterday, I learned that even at baseline, patient is always confused but not agitated.  5.  Sepsis, likely secondary to pneumonia, present on admission Blood and urine cultures showed no growth to date.  Procalcitonin and CRP are unremarkable.  Repeat chest x-ray showed unchanged pleural effusion with right lower lobe opacity.  Remains afebrile, has some leukocytosis.  Sepsis parameters seem to have resolved.  Antibiotics discontinued.  6.  Diabetes mellitus type 2: Blood sugar control.  Continue SSI.  7.  Essential hypertension Controlled.  Continue IV Lopressor and switch to Coreg or Toprol-XL hopefully tomorrow if takes p.o.  8.  Urinary retention/benign prostatic hyperplasia Patient on Foley catheter Continue with tamsulosin.   9.  Acute systolic congestive heart failure.  NYHA class II-III, ACC/AHA C.  Last echocardiogram was 11/21 showed EF of 25 to 30%. Fluid restriction to 1500 cc/day Low-sodium/2 g diet Daily weigh. Cardiology on board and signed off today.  He is on Lasix 40 mg IV twice daily.   DVT prophylaxis: Lovenox subcutaneous Code Status: DNR  Family Communication: None at bedside .  Called and spoke to his son Nicholas Trujillo yesterday.  Disposition Plan: Patient is  from nursing facility and will be discharged back to nursing facility Barriers to discharge still not taking enough oral intake and is significantly confused.   Consultants:   Cardiology  Orthopedics  Procedures: None  Antimicrobials:  07/22/2019 vancomycin and cefepime    Subjective: Seen and examined.  Once again he was completely confused with mittens in the hands.  He was not agitated.  Objective: Vitals:   07/27/19 0408 07/27/19 0754 07/27/19 0809 07/27/19 1208  BP:  131/87 115/87 109/77  Pulse:   84   Resp: 11 15 (!) 23   Temp:  97.9 F (36.6 C)  97.7 F (36.5 C)  TempSrc:  Axillary  Axillary  SpO2:   98% 99%  Weight:      Height:        Intake/Output Summary (Last 24 hours) at 07/27/2019 1335 Last data filed at 07/27/2019 1000 Gross per 24 hour  Intake 1300 ml  Output 2105 ml  Net -805 ml   Filed Weights   07/25/19 0400 07/26/19 0338 07/27/19 0406  Weight: 83.5 kg 78.9 kg 82.6 kg    Examination:  General exam: Appears comfortable but confused Respiratory system: Diminished breath sounds at the bases bilaterally Cardiovascular system: S1 & S2 heard, irregularly regular rate and rhythm. No JVD, murmurs, rubs, gallops or clicks. No pedal edema. Gastrointestinal system: Abdomen is nondistended, soft and nontender. No organomegaly or  masses felt. Normal bowel sounds heard. Central nervous system: Alert but confused.  No focal deficit.  Data Reviewed: I have personally reviewed following labs and imaging studies  CBC: Recent Labs  Lab 07/21/19 0239 07/21/19 0239 07/21/19 0609 07/22/19 0343 07/23/19 0357 07/24/19 0411 07/25/19 0353 07/26/19 0334 07/27/19 0447  WBC 17.2*   < > 18.6*   < > 12.8* 13.0* 13.3* 11.5* 14.5*  NEUTROABS 14.5*  --  15.4*  --   --   --   --   --   --   HGB 14.7   < > 14.6   < > 14.6 14.9 15.5 15.7 15.1  HCT 43.4   < > 45.1   < > 44.0 44.7 47.2 48.2 46.3  MCV 96.4   < > 100.9*   < > 97.1 96.8 96.9 99.6 100.4*  PLT 200   < >  209   < > 176 165 152 146* 114*   < > = values in this interval not displayed.   Basic Metabolic Panel: Recent Labs  Lab 07/22/19 1529 07/22/19 1529 07/23/19 0357 07/24/19 0411 07/25/19 0353 07/26/19 0334 07/27/19 0447  NA 141   < > 140 144 147* 152* 151*  K 4.0   < > 3.8 3.0* 3.2* 3.7 3.5  CL 100   < > 100 100 103 106 106  CO2 27   < > 26 31 33* 32 32  GLUCOSE 149*   < > 138* 117* 145* 142* 177*  BUN 34*   < > 37* 36* 36* 42* 37*  CREATININE 1.16   < > 0.95 0.96 0.94 1.02 0.80  CALCIUM 9.3   < > 9.0 8.8* 9.0 9.0 8.8*  MG 2.0  --   --   --  2.0 2.2  --   PHOS  --   --   --   --  3.2 3.3  --    < > = values in this interval not displayed.   GFR: Estimated Creatinine Clearance: 70.1 mL/min (by C-G formula based on SCr of 0.8 mg/dL). Liver Function Tests: Recent Labs  Lab 07/21/19 0827 07/25/19 0353 07/26/19 0334  AST 27 74* 33  ALT 14 127* 82*  ALKPHOS 80 91 82  BILITOT 2.9* 2.0* 2.1*  PROT 5.9* 5.7* 5.5*  ALBUMIN 3.5 3.0* 2.9*   No results for input(s): LIPASE, AMYLASE in the last 168 hours. No results for input(s): AMMONIA in the last 168 hours. Coagulation Profile: Recent Labs  Lab 07/21/19 0239  INR 1.2   Cardiac Enzymes: No results for input(s): CKTOTAL, CKMB, CKMBINDEX, TROPONINI in the last 168 hours. BNP (last 3 results) No results for input(s): PROBNP in the last 8760 hours. HbA1C: No results for input(s): HGBA1C in the last 72 hours. CBG: Recent Labs  Lab 07/26/19 2029 07/26/19 2349 07/27/19 0413 07/27/19 0750 07/27/19 1150  GLUCAP 171* 167* 174* 135* 169*   Lipid Profile: No results for input(s): CHOL, HDL, LDLCALC, TRIG, CHOLHDL, LDLDIRECT in the last 72 hours. Thyroid Function Tests: No results for input(s): TSH, T4TOTAL, FREET4, T3FREE, THYROIDAB in the last 72 hours. Anemia Panel: No results for input(s): VITAMINB12, FOLATE, FERRITIN, TIBC, IRON, RETICCTPCT in the last 72 hours. Sepsis Labs: Recent Labs  Lab 07/21/19 0827  07/26/19 0334  PROCALCITON <0.10 <0.10    Recent Results (from the past 240 hour(s))  Respiratory Panel by RT PCR (Flu A&B, Covid) - Nasopharyngeal Swab     Status: None   Collection Time: 07/21/19  4:00 AM   Specimen: Nasopharyngeal Swab  Result Value Ref Range Status   SARS Coronavirus 2 by RT PCR NEGATIVE NEGATIVE Final    Comment: (NOTE) SARS-CoV-2 target nucleic acids are NOT DETECTED. The SARS-CoV-2 RNA is generally detectable in upper respiratoy specimens during the acute phase of infection. The lowest concentration of SARS-CoV-2 viral copies this assay can detect is 131 copies/mL. A negative result does not preclude SARS-Cov-2 infection and should not be used as the sole basis for treatment or other patient management decisions. A negative result may occur with  improper specimen collection/handling, submission of specimen other than nasopharyngeal swab, presence of viral mutation(s) within the areas targeted by this assay, and inadequate number of viral copies (<131 copies/mL). A negative result must be combined with clinical observations, patient history, and epidemiological information. The expected result is Negative. Fact Sheet for Patients:  https://www.moore.com/ Fact Sheet for Healthcare Providers:  https://www.young.biz/ This test is not yet ap proved or cleared by the [place] FDA and  has been authorized for detection and/or diagnosis of SARS-CoV-2 by FDA under an Emergency Use Authorization (EUA). This EUA will remain  in effect (meaning this test can be used) for the duration of the COVID-19 declaration under Section 564(b)(1) of the Act, 21 U.S.C. section 360bbb-3(b)(1), unless the authorization is terminated or revoked sooner.    Influenza A by PCR NEGATIVE NEGATIVE Final   Influenza B by PCR NEGATIVE NEGATIVE Final    Comment: (NOTE) The Xpert Xpress SARS-CoV-2/FLU/RSV assay is intended as an aid in  the  diagnosis of influenza from Nasopharyngeal swab specimens and  should not be used as a sole basis for treatment. Nasal washings and  aspirates are unacceptable for Xpert Xpress SARS-CoV-2/FLU/RSV  testing. Fact Sheet for Patients: https://www.moore.com/ Fact Sheet for Healthcare Providers: https://www.young.biz/ This test is not yet approved or cleared by the [place] FDA and  has been authorized for detection and/or diagnosis of SARS-CoV-2 by  FDA under an Emergency Use Authorization (EUA). This EUA will remain  in effect (meaning this test can be used) for the duration of the  Covid-19 declaration under Section 564(b)(1) of the Act, 21  U.S.C. section 360bbb-3(b)(1), unless the authorization is  terminated or revoked. Performed at [facility], [address], [place], [place] [address]   Surgical PCR screen     Status: Abnormal   Collection Time: 07/22/19  5:24 AM   Specimen: Nasal Mucosa; Nasal Swab  Result Value Ref Range Status   MRSA, PCR POSITIVE (A) NEGATIVE Final    Comment: RESULT CALLED TO, READ BACK BY AND VERIFIED WITH: RN Cheri Guppy [phone] FCP    Staphylococcus aureus POSITIVE (A) NEGATIVE Final    Comment: (NOTE) The Xpert SA Assay (FDA approved for NASAL specimens in patients 46 years of age and older), is one component of a comprehensive surveillance program. It is not intended to diagnose infection nor to guide or monitor treatment. Performed at [facility], [address] [place], [place] [address]   Culture, Urine     Status: None   Collection Time: 07/22/19  2:23 PM   Specimen: Urine, Catheterized  Result Value Ref Range Status   Specimen Description URINE, CATHETERIZED  Final   Special Requests NONE  Final   Culture   Final    NO GROWTH Performed at [facility], [address], [place], [place] [address]    Report Status 07/23/2019 FINAL  Final  Culture, blood (  routine x 2)      Status: None   Collection Time: 07/22/19  3:29 PM   Specimen: BLOOD LEFT HAND  Result Value Ref Range Status   Specimen Description BLOOD LEFT HAND  Final   Special Requests   Final    BOTTLES DRAWN AEROBIC ONLY Blood Culture results may not be optimal due to an inadequate volume of blood received in culture bottles   Culture   Final    NO GROWTH 5 DAYS Performed at [facility], [address] [place], [place] [address]    Report Status 07/27/2019 FINAL  Final  Culture, blood (routine x 2)     Status: None   Collection Time: 07/22/19  3:37 PM   Specimen: BLOOD RIGHT HAND  Result Value Ref Range Status   Specimen Description BLOOD RIGHT HAND  Final   Special Requests   Final    BOTTLES DRAWN AEROBIC ONLY Blood Culture results may not be optimal due to an inadequate volume of blood received in culture bottles   Culture   Final    NO GROWTH 5 DAYS Performed at [facility], [address] [place], [place] [address]    Report Status 07/27/2019 FINAL  Final  Surgical pcr screen     Status: None   Collection Time: 07/26/19 11:46 AM   Specimen: Nasal Mucosa; Nasal Swab  Result Value Ref Range Status   MRSA, PCR NEGATIVE NEGATIVE Final   Staphylococcus aureus NEGATIVE NEGATIVE Final    Comment: (NOTE) The Xpert SA Assay (FDA approved for NASAL specimens in patients 78 years of age and older), is one component of a comprehensive surveillance program. It is not intended to diagnose infection nor to guide or monitor treatment. Performed at [facility], [address] [place], [place] [address]          Radiology Studies: Pelvis Portable  Result Date: 07/26/2019 CLINICAL DATA:  Right hip prosthesis. EXAM: PORTABLE PELVIS 1-2 VIEWS COMPARISON:  Intraoperative spot films, same date. FINDINGS: The bipolar hip prosthesis appears to be in good position without complicating features. No periprosthetic fracture. The left hip is intact. Advanced vascular  calcifications. IMPRESSION: Bipolar right hip prosthesis in good position without complicating features. Electronically Signed   By: Rudie Meyer M.D.   On: 07/26/2019 16:49   DG C-Arm 1-60 Min  Result Date: 07/26/2019 CLINICAL DATA:  Right hip hemiarthroplasty. EXAM: OPERATIVE right HIP (WITH PELVIS IF PERFORMED) 2 VIEWS TECHNIQUE: Fluoroscopic spot image(s) were submitted for interpretation post-operatively. COMPARISON:  None. FINDINGS: Intraoperative fluoro spot images are submitted. Right hip hemiarthroplasty is noted. No fractures are present. IMPRESSION: Right hip hemiarthroplasty without radiographic evidence for complication. Electronically Signed   By: Marin Roberts M.D.   On: 07/26/2019 16:51   DG HIP OPERATIVE UNILAT W OR W/O PELVIS RIGHT  Result Date: 07/26/2019 CLINICAL DATA:  Right hip hemiarthroplasty. EXAM: OPERATIVE right HIP (WITH PELVIS IF PERFORMED) 2 VIEWS TECHNIQUE: Fluoroscopic spot image(s) were submitted for interpretation post-operatively. COMPARISON:  None. FINDINGS: Intraoperative fluoro spot images are submitted. Right hip hemiarthroplasty is noted. No fractures are present. IMPRESSION: Right hip hemiarthroplasty without radiographic evidence for complication. Electronically Signed   By: Marin Roberts M.D.   On: 07/26/2019 16:51        Scheduled Meds: . Chlorhexidine Gluconate Cloth  6 each Topical Daily  . docusate sodium  100 mg Oral BID  . enoxaparin (LOVENOX) injection  40 mg Subcutaneous Q24H  . feeding supplement (  ENSURE ENLIVE)  237 mL Oral TID BM  . feeding supplement (PRO-STAT SUGAR FREE 64)  30 mL Oral TID  . furosemide  40 mg Intravenous BID  . insulin aspart  0-5 Units Subcutaneous QHS  . insulin aspart  0-9 Units Subcutaneous TID WC  . lidocaine  1 patch Transdermal QHS  . metoprolol tartrate  5 mg Intravenous Q6H   Continuous Infusions: . lactated ringers 10 mL/hr at 07/26/19 1405  . methocarbamol (ROBAXIN) IV       LOS: 6 days     Time spent: 30 minutes   Nicholas Closs, MD [facility]  If 7PM-7AM, please contact night-coverage [email or web address] Password [facility] 07/27/2019, 1:35 PM

## 2019-07-27 NOTE — Progress Notes (Signed)
Pharmacy Antibiotic Note  Nicholas DILONE Sr. is a 87 y.o. male admitted on 07/21/2019 with pneumonia.  Pharmacy has been consulted for vancomycin and cefepime dosing. Vanco now off, can likely stop cefepime too.   Plan: -Cefepime 2g IV q12h for now  Height: 6' (182.9 cm) Weight: 182 lb (82.6 kg) IBW/kg (Calculated) : 77.6  Temp (24hrs), Avg:97.6 F (36.4 C), Min:97.4 F (36.3 C), Max:97.9 F (36.6 C)  Recent Labs  Lab 07/23/19 0357 07/24/19 0411 07/25/19 0353 07/26/19 0334 07/27/19 0447  WBC 12.8* 13.0* 13.3* 11.5* 14.5*  CREATININE 0.95 0.96 0.94 1.02 0.80    Estimated Creatinine Clearance: 70.1 mL/min (by C-G formula based on SCr of 0.8 mg/dL).    Allergies  Allergen Reactions  . Atorvastatin Other (See Comments)    Myalgia Other reaction(s): Myalgias (intolerance)   . Codeine Nausea And Vomiting    Antimicrobials this admission:  Cefepime 2/11 > Vancomycin 2/11 > 2/14   Microbiology results:  2/11 BCx x 2: 2/11 UCx x 2: 2/11 MRSA PCR: + 2/10 Flu/COVID neg  Thank you for allowing pharmacy to be a part of this patient's care.  Fredonia Highland, PharmD, BCPS Clinical Pharmacist [phone] Please check AMION for all [facility] Pharmacy numbers 07/27/2019

## 2019-07-27 NOTE — Progress Notes (Signed)
  Speech Language Pathology Treatment: Dysphagia  Patient Details Name: Nicholas Trujillo. MRN: [id number] DOB: Aug 20, 1928 Today's Date: 07/27/2019 Time: 2094-7096 SLP Time Calculation (min) (ACUTE ONLY): 19 min  Assessment / Plan / Recommendation Clinical Impression  Patient seen at bedside for skilled ST targeting dysphagia. Patient upright in bed, confused and impulsive. Pt did not answer any orientation questions. ST completed oral care with toothbrush via in-line suction.  Patient underwent hip arthroplasty yesterday under general anesthesia and is somewhat "groggy" when seen today. Patient oxygen saturation 97-100% and RR in mid teens per tele during this session.  Patient seen with thin liquids via cup and straw, patient with some difficulty orally grasping straw initially, but able to do so with verbal cues and siphon liquids. No overt s/sx aspiration with thin liquids, though belching noted following most straw sips. Patient seen with puree solids: grossly adequate oral acceptance, limited bolus manipulation, but adequate AP transfer and good clearance after the swallow. Pt seen with chopped peaches mixed with applesauce: patient with limited bolus manipulation, very limited mastication prior to initiation of the swallow. Pt appearing to swallow the chopped peaches without masticating them. Oral clearance achieved. No overt s/sx aspiration. Recommend dysphagia 1 solids with thin liquids. ST to follow and assess for diet upgrade/advance solids as mentation improves. RN educated and verbalized understanding.   HPI HPI: Pt is an 86 y.o. male with history of alcohol abuse previous history of diabetes CAD status post stenting many years ago with history of alcohol abuse who had a compression fracture around 3 to 4 months ago. Two days prior pt's was found to be having some shortness of breath and was diagnosed with possible pneumonia. He presented to the ED after an unwitnessed fall at the nursing  facility. CT of the head was negative. Chest x-ray of 2/10 revealed  CHF/volume overload with edema and cardiomegaly. WBC is currently elevated. Pt is afebrile. Chest x-ray of 2/11: Progressive hazy opacities throughout both lungs likely combination of layering pleural effusions and pulmonary edema      SLP Plan  Continue with current plan of care       Recommendations  Diet recommendations: Dysphagia 1 (puree);Thin liquid Medication Administration: Crushed with puree Supervision: Full supervision/cueing for compensatory strategies;Trained caregiver to feed patient Compensations: Slow rate;Small sips/bites;Minimize environmental distractions Postural Changes and/or Swallow Maneuvers: Seated upright 90 degrees;Upright 30-60 min after meal                Oral Care Recommendations: Oral care BID Follow up Recommendations: Other (comment) SLP Visit Diagnosis: Dysphagia, oral phase (R13.11) Plan: Continue with current plan of care       GO                Lockie Bothun 07/27/2019, 1:33 PM Shella Spearing, M.Ed., CCC-SLP Speech Therapy Acute Rehabilitation [phone]: Acute Rehab office [phone] - pager

## 2019-07-28 DIAGNOSIS — A419 Sepsis, unspecified organism: Secondary | ICD-10-CM

## 2019-07-28 DIAGNOSIS — R339 Retention of urine, unspecified: Secondary | ICD-10-CM

## 2019-07-28 DIAGNOSIS — N401 Enlarged prostate with lower urinary tract symptoms: Secondary | ICD-10-CM

## 2019-07-28 DIAGNOSIS — E870 Hyperosmolality and hypernatremia: Secondary | ICD-10-CM

## 2019-07-28 DIAGNOSIS — R338 Other retention of urine: Secondary | ICD-10-CM

## 2019-07-28 DIAGNOSIS — W19XXXA Unspecified fall, initial encounter: Secondary | ICD-10-CM

## 2019-07-28 DIAGNOSIS — J189 Pneumonia, unspecified organism: Secondary | ICD-10-CM

## 2019-07-28 DIAGNOSIS — E1165 Type 2 diabetes mellitus with hyperglycemia: Secondary | ICD-10-CM

## 2019-07-28 DIAGNOSIS — Y92129 Unspecified place in nursing home as the place of occurrence of the external cause: Secondary | ICD-10-CM

## 2019-07-28 LAB — GLUCOSE, CAPILLARY
Glucose-Capillary: 137 mg/dL — ABNORMAL HIGH (ref 70–99)
Glucose-Capillary: 139 mg/dL — ABNORMAL HIGH (ref 70–99)
Glucose-Capillary: 145 mg/dL — ABNORMAL HIGH (ref 70–99)
Glucose-Capillary: 156 mg/dL — ABNORMAL HIGH (ref 70–99)
Glucose-Capillary: 165 mg/dL — ABNORMAL HIGH (ref 70–99)
Glucose-Capillary: 236 mg/dL — ABNORMAL HIGH (ref 70–99)

## 2019-07-28 LAB — CBC
HCT: 47.6 % (ref 39.0–52.0)
Hemoglobin: 15.4 g/dL (ref 13.0–17.0)
MCH: 32.9 pg (ref 26.0–34.0)
MCHC: 32.4 g/dL (ref 30.0–36.0)
MCV: 101.7 fL — ABNORMAL HIGH (ref 80.0–100.0)
Platelets: 119 10*3/uL — ABNORMAL LOW (ref 150–400)
RBC: 4.68 MIL/uL (ref 4.22–5.81)
RDW: 14.8 % (ref 11.5–15.5)
WBC: 17 10*3/uL — ABNORMAL HIGH (ref 4.0–10.5)
nRBC: 0 % (ref 0.0–0.2)

## 2019-07-28 LAB — BASIC METABOLIC PANEL
Anion gap: 14 (ref 5–15)
BUN: 43 mg/dL — ABNORMAL HIGH (ref 8–23)
CO2: 34 mmol/L — ABNORMAL HIGH (ref 22–32)
Calcium: 9.2 mg/dL (ref 8.9–10.3)
Chloride: 106 mmol/L (ref 98–111)
Creatinine, Ser: 0.89 mg/dL (ref 0.61–1.24)
GFR calc Af Amer: >60 mL/min (ref >60)
GFR calc non Af Amer: >60 mL/min (ref >60)
Glucose, Bld: 143 mg/dL — ABNORMAL HIGH (ref 70–99)
Potassium: 3.4 mmol/L — ABNORMAL LOW (ref 3.5–5.1)
Sodium: 154 mmol/L — ABNORMAL HIGH (ref 135–145)

## 2019-07-28 LAB — T4, FREE: Free T4: 0.99 ng/dL (ref 0.61–1.12)

## 2019-07-28 LAB — AMMONIA: Ammonia: 37 umol/L — ABNORMAL HIGH (ref 9–35)

## 2019-07-28 LAB — VITAMIN B12: Vitamin B-12: 425 pg/mL (ref 180–914)

## 2019-07-28 MED ORDER — DEXTROSE 5 % IV SOLN: INTRAVENOUS | Status: AC

## 2019-07-28 MED ORDER — JUVEN PO PACK
1.0000 | PACK | Freq: Two times a day (BID) | ORAL | Status: DC
  Administered 2019-07-29 – 2019-07-31 (×5): 1 via ORAL
  Filled 2019-07-28 (×7): qty 1

## 2019-07-28 MED ORDER — ENSURE ENLIVE PO LIQD
237.0000 mL | Freq: Three times a day (TID) | ORAL | Status: DC
  Administered 2019-07-29 – 2019-08-01 (×8): 237 mL via ORAL

## 2019-07-28 MED ORDER — THIAMINE HCL 100 MG/ML IJ SOLN
250.0000 mg | INTRAVENOUS | Status: DC
  Administered 2019-07-28 – 2019-07-31 (×4): 250 mg via INTRAVENOUS
  Filled 2019-07-28 (×5): qty 2.5

## 2019-07-28 MED ORDER — SPIRONOLACTONE 25 MG PO TABS
25.0000 mg | ORAL_TABLET | Freq: Every day | ORAL | Status: DC
  Administered 2019-07-28: 11:00:00 25 mg via ORAL
  Filled 2019-07-28: qty 1

## 2019-07-28 NOTE — TOC Progression Note (Signed)
Transition of Care (TOC) - Progression Note    Patient Details  Name: MASTER TOUCHET Sr. MRN: [id number] Date of Birth: 1928/12/08  Transition of Care [facility]) CM/SW Contact  Eduard Roux, [place] Phone Number: 07/28/2019, 3:17 PM  Clinical Narrative:     CSW spoke with [facility]- they have confirmed the patient will return to SNF. Per MD patient is not medically ready for discharge today possibly tomorrow.   Covid was completed yesterday.   CSW will continue to follow.  Antony Blackbird, MSW, LCSWA Clinical Social Worker    Expected Discharge Plan: Assisted Living Barriers to Discharge: Continued Medical Work up  Expected Discharge Plan and Services Expected Discharge Plan: Assisted Living       Living arrangements for the past 2 months: Assisted Living Facility([facility])                                       Social Determinants of Health (SDOH) Interventions    Readmission Risk Interventions No flowsheet data found.

## 2019-07-28 NOTE — Progress Notes (Signed)
Nutrition Follow-up  RD working remotely.   DOCUMENTATION CODES:   Not applicable  INTERVENTION:   1 packet Juven BID, each packet provides 95 calories, 2.5 grams of protein (collagen), and 9.8 grams of carbohydrate (3 grams sugar); also contains 7 grams of L-arginine and L-glutamine, 300 mg vitamin C, 15 mg vitamin E, 1.2 mcg vitamin B-12, 9.5 mg zinc, 200 mg calcium, and 1.5 g  Calcium Beta-hydroxy-Beta-methylbutyrate to support wound healing  Magic cup BID with meals, each supplement provides 290 kcal and 9 grams of protein  Continue Ensure Enlive po TID, each supplement provides 350 kcal and 20 grams of protein (change time to with meals)  Continue 79ml Pro-stat TID, each serving provides 100kcal, 15g protein   NUTRITION DIAGNOSIS:   Increased nutrient needs related to hip fracture as evidenced by estimated needs.  Ongoing.  GOAL:   Patient will meet greater than or equal to 90% of their needs  Progressing.  MONITOR:   PO intake, Supplement acceptance, Weight trends, Labs, I & O's  REASON FOR ASSESSMENT:   Consult Hip fracture protocol  ASSESSMENT:   Pt with a PMH significant for EtOH abuse, DM, CAD, HLD, HTN, dementia, recent compression fracture 3-4 months ago who was admitted s/p fall with right hip fracture, acute CHF, new onset AFib.  2/15 - s/p hip arthroplasty    RD unable to reach pt via phone.  PO intake: 0-100% x last 8 recorded meals (56% average intake)   Noted that pt is refusing Ensure Enlive. Discussed pt with RN who feels pt is refusing Ensure due to timing and thinks he would do better with it if it came at the same time as his meals.   Medications reviewed and include: Colace, Ensure Enlive TID, 107ml Pro-stat TID, SSI, Aldactone  Labs reviewed: Na 154 (H), K+ 3.4 (L) CBGs 137-165  UOP: 2,341ml x24 hours I/O: -14,122ml since admit  Diet Order:   Diet Order            DIET - DYS 1 Room service appropriate? No; Fluid consistency: Thin   Diet effective now              EDUCATION NEEDS:   Not appropriate for education at this time  Skin:  Skin Assessment: Skin Integrity Issues: Skin Integrity Issues:: Incisions Incisions: right hip  Last BM:  2/15  Height:   Ht Readings from Last 1 Encounters:  07/22/19 6' (1.829 m)    Weight:   Wt Readings from Last 1 Encounters:  07/28/19 78.5 kg    BMI:  Body mass index is 23.46 kg/m.  Estimated Nutritional Needs:   Kcal:  1900-2100  Protein:  100-115 grams  Fluid:  >1.9L/d   Eugene Gavia, MS, RD, LDN RD pager number and weekend/on-call pager number located in Amion.

## 2019-07-28 NOTE — Care Management Important Message (Signed)
Important Message  Patient Details  Name: Nicholas CISLO Sr. MRN: [id number] Date of Birth: 02-27-1929   Medicare Important Message Given:  Yes     Renie Ora 07/28/2019, 9:12 AM

## 2019-07-28 NOTE — NC FL2 (Signed)
[place] MEDICAID FL2 LEVEL OF CARE SCREENING TOOL     IDENTIFICATION  Patient Name: Nicholas Trujillo. Birthdate: 07/30/1928 Sex: male Admission Date (Current Location): 07/21/2019  [facility] and [place] Number:  Producer, television/film/video and Address:  [facility], [address], [place], [place] [address]      Provider Number: [id number]  Attending Physician Name and Address:  Almon Hercules, MD  Relative Name and Phone Number:  Lucy Woolever [phone]    Current Level of Care: Hospital Recommended Level of Care: Skilled Nursing Facility Prior Approval Number:    Date Approved/Denied:   PASRR Number: [id number]  Discharge Plan: SNF    Current Diagnoses: Patient Active Problem List   Diagnosis Date Noted  . Essential hypertension 07/22/2019  . Hyperlipidemia 07/22/2019  . Diabetes mellitus with hyperglycemia (HCC) 07/22/2019  . Urinary retention 07/22/2019  . Acute hypoxemic respiratory failure (HCC) 07/22/2019  . Closed displaced fracture of right femoral neck (HCC) 07/21/2019  . Acute CHF (congestive heart failure) (HCC) 07/21/2019  . New onset atrial fibrillation (HCC) 07/21/2019  . Hip fracture (HCC) 07/21/2019  . SIRS (systemic inflammatory response syndrome) (HCC) 07/21/2019    Orientation RESPIRATION BLADDER Height & Weight        O2(Nasal Cannula) External catheter, Incontinent Weight: 173 lb (78.5 kg) Height:  6' (182.9 cm)  BEHAVIORAL SYMPTOMS/MOOD NEUROLOGICAL BOWEL NUTRITION STATUS      Continent    AMBULATORY STATUS COMMUNICATION OF NEEDS Skin   Extensive Assist Verbally Surgical wounds(Surgical Incision)                       Personal Care Assistance Level of Assistance  Bathing, Dressing, Feeding Bathing Assistance: Maximum assistance Feeding assistance: Maximum assistance Dressing Assistance: Maximum assistance     Functional Limitations Info             SPECIAL CARE FACTORS FREQUENCY  PT (By licensed  PT)     PT Frequency: 5x a week              Contractures Contractures Info: Not present    Additional Factors Info  Code Status, Allergies Code Status Info: DNR Allergies Info: Atorvastatin, Codeine           Current Medications (07/28/2019):  This is the current hospital active medication list Current Facility-Administered Medications  Medication Dose Route Frequency Provider Last Rate Last Admin  . acetaminophen (TYLENOL) tablet 325-650 mg  325-650 mg Oral Q6H PRN Tarry Kos, MD   650 mg at 07/27/19 2154  . alum & mag hydroxide-simeth (MAALOX/MYLANTA) 200-200-20 MG/5ML suspension 30 mL  30 mL Oral Q4H PRN Tarry Kos, MD      . Chlorhexidine Gluconate Cloth 2 % PADS 6 each  6 each Topical Daily Hughie Closs, MD   6 each at 07/28/19 0817  . dextrose 5 % solution   Intravenous Continuous Almon Hercules, MD 75 mL/hr at 07/28/19 1054 New Bag at 07/28/19 1054  . docusate sodium (COLACE) capsule 100 mg  100 mg Oral BID Tarry Kos, MD   100 mg at 07/28/19 0817  . enoxaparin (LOVENOX) injection 40 mg  40 mg Subcutaneous Q24H Tarry Kos, MD   40 mg at 07/28/19 0817  . feeding supplement (ENSURE ENLIVE) (ENSURE ENLIVE) liquid 237 mL  237 mL Oral TID BM Tarry Kos, MD   237 mL at 07/27/19 1340  . feeding supplement (PRO-STAT SUGAR FREE  64) liquid 30 mL  30 mL Oral TID Leandrew Koyanagi, MD   30 mL at 07/28/19 0817  . HYDROcodone-acetaminophen (NORCO) 7.5-325 MG per tablet 1-2 tablet  1-2 tablet Oral Q4H PRN Leandrew Koyanagi, MD      . HYDROcodone-acetaminophen (NORCO/VICODIN) 5-325 MG per tablet 1-2 tablet  1-2 tablet Oral Q4H PRN Leandrew Koyanagi, MD      . insulin aspart (novoLOG) injection 0-5 Units  0-5 Units Subcutaneous QHS Leandrew Koyanagi, MD   2 Units at 07/27/19 2154  . insulin aspart (novoLOG) injection 0-9 Units  0-9 Units Subcutaneous TID WC Leandrew Koyanagi, MD   2 Units at 07/28/19 1316  . ipratropium-albuterol (DUONEB) 0.5-2.5 (3) MG/3ML nebulizer solution 3 mL  3 mL  Nebulization Q4H PRN Darliss Cheney, MD      . lactated ringers infusion   Intravenous Continuous Leandrew Koyanagi, MD 10 mL/hr at 07/26/19 1405 Restarted at 07/26/19 1548  . lidocaine (LIDODERM) 5 % 1 patch  1 patch Transdermal QHS Leandrew Koyanagi, MD   1 patch at 07/27/19 2155  . LORazepam (ATIVAN) injection 0.5-1 mg  0.5-1 mg Intravenous Q4H PRN Leandrew Koyanagi, MD   1 mg at 07/22/19 0159  . magnesium citrate solution 1 Bottle  1 Bottle Oral Once PRN Leandrew Koyanagi, MD      . menthol-cetylpyridinium (CEPACOL) lozenge 3 mg  1 lozenge Oral PRN Leandrew Koyanagi, MD       Or  . phenol (CHLORASEPTIC) mouth spray 1 spray  1 spray Mouth/Throat PRN Leandrew Koyanagi, MD      . methocarbamol (ROBAXIN) tablet 500 mg  500 mg Oral Q6H PRN Leandrew Koyanagi, MD       Or  . methocarbamol (ROBAXIN) 500 mg in dextrose 5 % 50 mL IVPB  500 mg Intravenous Q6H PRN Leandrew Koyanagi, MD      . metoprolol tartrate (LOPRESSOR) injection 5 mg  5 mg Intravenous Q6H Leandrew Koyanagi, MD   5 mg at 07/28/19 1317  . morphine 2 MG/ML injection 1 mg  1 mg Intravenous Q2H PRN Leandrew Koyanagi, MD   1 mg at 07/28/19 0444  . ondansetron (ZOFRAN) tablet 4 mg  4 mg Oral Q6H PRN Leandrew Koyanagi, MD       Or  . ondansetron [facility]) injection 4 mg  4 mg Intravenous Q6H PRN Leandrew Koyanagi, MD      . polyethylene glycol (MIRALAX / GLYCOLAX) packet 17 g  17 g Oral Daily PRN Leandrew Koyanagi, MD      . sorbitol 70 % solution 30 mL  30 mL Oral Daily PRN Leandrew Koyanagi, MD      . spironolactone (ALDACTONE) tablet 25 mg  25 mg Oral Daily Wendee Beavers T, MD   25 mg at 07/28/19 1050     Discharge Medications: Please see discharge summary for a list of discharge medications.  Relevant Imaging Results:  Relevant Lab Results:   Additional Information SSN [id number]  Vinie Sill, LCSWA

## 2019-07-28 NOTE — Progress Notes (Signed)
Subjective: 2 Days Post-Op Procedure(s) (LRB): HIP ARTHROPLASTY ANTERIOR APPROACH Hemiarthroplasty (Right) Patient resting comfortably  Objective: Vital signs in last 24 hours: Temp:  [97.7 F (36.5 C)-100.7 F (38.2 C)] 98.7 F (37.1 C) (02/17 0816) Pulse Rate:  [65-100] 100 (02/17 0816) Resp:  [13-21] 21 (02/17 0816) BP: (105-145)/(73-93) 145/76 (02/17 0816) SpO2:  [94 %-99 %] 94 % (02/17 0816) Weight:  [78.5 kg] 78.5 kg (02/17 0411)  Intake/Output from previous day: 02/16 0701 - 02/17 0700 In: 200 [P.O.:200] Out: 2380 [Urine:2380] Intake/Output this shift: No intake/output data recorded.  Recent Labs    07/26/19 0334 07/27/19 0447 07/28/19 0346  HGB 15.7 15.1 15.4   Recent Labs    07/27/19 0447 07/28/19 0346  WBC 14.5* 17.0*  RBC 4.61 4.68  HCT 46.3 47.6  PLT 114* 119*   Recent Labs    07/27/19 0447 07/28/19 0346  NA 151* 154*  K 3.5 3.4*  CL 106 106  CO2 32 34*  BUN 37* 43*  CREATININE 0.80 0.89  GLUCOSE 177* 143*  CALCIUM 8.8* 9.2   No results for input(s): LABPT, INR in the last 72 hours.  Neurovascular intact Intact pulses distally Incision: dressing C/D/I No cellulitis present Compartment soft   Assessment/Plan: 2 Days Post-Op Procedure(s) (LRB): HIP ARTHROPLASTY ANTERIOR APPROACH Hemiarthroplasty (Right) Up with therapy  WBAT RLE DVT ppx- Lovenox 40 daily x 2 weeks F/u with Dr. Roda Shutters 2 weeks post-op      Cristie Hem 07/28/2019, 10:18 AM

## 2019-07-28 NOTE — Progress Notes (Signed)
Nicholas Trujillo   PCP: Patient, No Pcp Per  Patient is from: Nursing facility  DOA: 07/21/2019 LOS: 7  Brief Narrative / Interim history: 86 year old male with history of alcohol abuse, CAD/stent, DM-2, recent compression fracture 3 to 4 months ago brought to ED from facility after unwitnessed fall and right femoral fracture.   Patient also hospitalized for compression fracture at [facility] and started to rehab 3 to 4 months ago. Underwent ORIF on 07/26/2019.  Also concern about HCAP based on initial chest x-ray although this was later thought to be due to pulmonary edema.  He completed empiric antibiotic with cefepime for 5 days.  Hospital course complicated by new A. Fib and new systolic CHF with EF of 25 to 30%.  Cardiology consulted.   Subjective: No major events overnight or this morning.  Not a great historian.  He is only oriented to self.  Currently on room air.  Does not appear to be in distress.  Has a telesitter.  Objective: Vitals:   07/27/19 2042 07/28/19 0000 07/28/19 0411 07/28/19 0816  BP: 107/73 (!) 105/93 122/73 (!) 145/76  Pulse: 93 98 65 100  Resp: 19 13 (!) 21 (!) 21  Temp: (!) 100.7 F (38.2 C) 97.9 F (36.6 C) 98.2 F (36.8 C) 98.7 F (37.1 C)  TempSrc: Axillary Axillary Axillary Oral  SpO2: 97% 98% 99% 94%  Weight:   78.5 kg   Height:        Intake/Output Summary (Last 24 hours) at 07/28/2019 1546 Last data filed at 07/28/2019 1300 Gross per 24 hour  Intake 456.47 ml  Output 1480 ml  Net -1023.53 ml   Filed Weights   07/26/19 0338 07/27/19 0406 07/28/19 0411  Weight: 78.9 kg 82.6 kg 78.5 kg    Examination:  GENERAL: No acute distress.  Appears well.  HEENT: MMM.  Vision and hearing grossly intact.  NECK: Supple.  No apparent JVD.  RESP:  No IWOB. Good air movement bilaterally. CVS:  RRR. Heart sounds normal.  ABD/GI/GU: Bowel sounds present. Soft. Non tender.  MSK/EXT: Surgical  dressing over right hip clean, dry and intact. SKIN: no apparent skin lesion or wound NEURO: Awake, alert and oriented appropriately.  No apparent focal neuro deficit. PSYCH: Calm. Normal affect.  Procedures:  2/15-prosthetic replacement for femoral neck fracture by Dr. Erlinda Hong  Assessment & Plan: Fall at nursing home Closed displaced fracture of right femoral neck -Status post prosthetic replacement of femoral neck fracture on 2/15 as above -Orthopedic surgery managing. -PT/OT  Acute respiratory failure with hypoxia-likely due to CHF exacerbation and possibly HCAP. -Empirically treated with cefepime for 5 days although infectious process thought to be less likely -Manage CHF as below.  Acute on chronic systolic CHF: Echo with EF of 25 to 30%, global hypokinesis, severe LAE, moderate RAE, normal RVSF.  -Cardiology managing. -Monitor fluid status, renal function and electrolytes.  New onset atrial fibrillation: CHA2DS2-VASc score 5 but not a good candidate for anticoagulation due to recurrent fall. -Continue Toprol per cardiology  Acute metabolic encephalopathy in patient with history of dementia: Oriented to self only.  Reportedly has some baseline confusion.  B12 was normal. TSH slightly high. -Start high-dose thiamine empirically given history of alcohol abuse. -Check free T4, B1, thiamine level, ammonia and RPR -Continue home memantine  Hypernatremia: Na 154 -Start D5 at 75 cc an hour. -Monitor sodium level.  Hypokalemia -Replenish and recheck  Alcohol use disorder: no EtOH for the  last three months  Sepsis secondary to HCAP: POA -Completed antibiotic course as above.  Uncontrolled DM-2 with hyperglycemia Recent Labs    07/28/19 0400 07/28/19 0748 07/28/19 1237  GLUCAP 145* 139* 165*  -Continue monitoring and SSI  Essential hypertension: BP within fair range. -Continue IV Lopressor  BPH with acute urinary retention -Continue Foley  catheter  Leukocytosis/bandemia -Continue monitoring  Thrombocytopenia: Stable -Continue monitoring       Nutrition Problem: Increased nutrient needs Etiology: hip fracture  Signs/Symptoms: estimated needs  Interventions: Ensure Enlive (each supplement provides 350kcal and 20 grams of protein), MVI, Prostat   DVT prophylaxis: Subcu Lovenox Code Status: DNR/DNI Family Communication: Updated patient's son over the phone.  Appreciative about his father's care.  Discharge barrier: Encephalopathy and hypernatremia Patient is from: Nursing home Final disposition: SNF when medically stable and cleared by consultants  Consultants: Orthopedic surgery and cardiology   Microbiology summarized: COVID-19 negative  blood cultures negative  urine cultures negative  Sch Meds:  Scheduled Meds: . Chlorhexidine Gluconate Cloth  6 each Topical Daily  . docusate sodium  100 mg Oral BID  . enoxaparin (LOVENOX) injection  40 mg Subcutaneous Q24H  . feeding supplement (ENSURE ENLIVE)  237 mL Oral TID BM  . feeding supplement (PRO-STAT SUGAR FREE 64)  30 mL Oral TID  . insulin aspart  0-5 Units Subcutaneous QHS  . insulin aspart  0-9 Units Subcutaneous TID WC  . lidocaine  1 patch Transdermal QHS  . metoprolol tartrate  5 mg Intravenous Q6H  . spironolactone  25 mg Oral Daily   Continuous Infusions: . dextrose 75 mL/hr at 07/28/19 1054  . lactated ringers 10 mL/hr at 07/26/19 1405  . methocarbamol (ROBAXIN) IV     PRN Meds:.acetaminophen, alum & mag hydroxide-simeth, HYDROcodone-acetaminophen, HYDROcodone-acetaminophen, ipratropium-albuterol, LORazepam, magnesium citrate, menthol-cetylpyridinium **OR** phenol, methocarbamol **OR** methocarbamol (ROBAXIN) IV, morphine injection, ondansetron **OR** ondansetron (ZOFRAN) IV, polyethylene glycol, sorbitol  Antimicrobials: Anti-infectives (From admission, onward)   Start     Dose/Rate Route Frequency Ordered Stop   07/26/19 1845  ceFAZolin  (ANCEF) IVPB 2g/100 mL premix  Status:  Discontinued     2 g 200 mL/hr over 30 Minutes Intravenous Every 6 hours 07/26/19 1844 07/26/19 1855   07/26/19 1544  vancomycin (VANCOCIN) powder  Status:  Discontinued       As needed 07/26/19 1544 07/26/19 1648   07/26/19 1351  ceFAZolin (ANCEF) 2-4 GM/100ML-% IVPB    Note to Pharmacy: Tawanna Sat   : cabinet override      07/26/19 1351 07/27/19 0159   07/26/19 0600  ceFAZolin (ANCEF) IVPB 2g/100 mL premix    Note to Pharmacy: Anesthesia to give preop   2 g 200 mL/hr over 30 Minutes Intravenous  Once 07/25/19 2013 07/26/19 0650   07/22/19 1445  vancomycin (VANCOREADY) IVPB 1500 mg/300 mL  Status:  Discontinued     1,500 mg 150 mL/hr over 120 Minutes Intravenous Every 24 hours 07/22/19 1433 07/25/19 1239   07/22/19 1430  ceFEPIme (MAXIPIME) 2 g in sodium chloride 0.9 % 100 mL IVPB  Status:  Discontinued     2 g 200 mL/hr over 30 Minutes Intravenous Every 12 hours 07/22/19 1427 07/27/19 0839   07/22/19 0600  ceFAZolin (ANCEF) IVPB 2g/100 mL premix  Status:  Discontinued     2 g 200 mL/hr over 30 Minutes Intravenous On call to O.R. 07/21/19 1620 07/22/19 1359       I have personally reviewed the following labs and images: CBC: Recent  Labs  Lab 07/24/19 0411 07/25/19 0353 07/26/19 0334 07/27/19 0447 07/28/19 0346  WBC 13.0* 13.3* 11.5* 14.5* 17.0*  HGB 14.9 15.5 15.7 15.1 15.4  HCT 44.7 47.2 48.2 46.3 47.6  MCV 96.8 96.9 99.6 100.4* 101.7*  PLT 165 152 146* 114* 119*   BMP &GFR Recent Labs  Lab 07/22/19 1529 07/23/19 0357 07/24/19 0411 07/25/19 0353 07/26/19 0334 07/27/19 0447 07/28/19 0346  NA 141   < > 144 147* 152* 151* 154*  K 4.0   < > 3.0* 3.2* 3.7 3.5 3.4*  CL 100   < > 100 103 106 106 106  CO2 27   < > 31 33* 32 32 34*  GLUCOSE 149*   < > 117* 145* 142* 177* 143*  BUN 34*   < > 36* 36* 42* 37* 43*  CREATININE 1.16   < > 0.96 0.94 1.02 0.80 0.89  CALCIUM 9.3   < > 8.8* 9.0 9.0 8.8* 9.2  MG 2.0  --   --  2.0 2.2   --   --   PHOS  --   --   --  3.2 3.3  --   --    < > = values in this interval not displayed.   Estimated Creatinine Clearance: 63 mL/min (by C-G formula based on SCr of 0.89 mg/dL). Liver & Pancreas: Recent Labs  Lab 07/25/19 0353 07/26/19 0334  AST 74* 33  ALT 127* 82*  ALKPHOS 91 82  BILITOT 2.0* 2.1*  PROT 5.7* 5.5*  ALBUMIN 3.0* 2.9*   No results for input(s): LIPASE, AMYLASE in the last 168 hours. No results for input(s): AMMONIA in the last 168 hours. Diabetic: No results for input(s): HGBA1C in the last 72 hours. Recent Labs  Lab 07/27/19 2044 07/28/19 0025 07/28/19 0400 07/28/19 0748 07/28/19 1237  GLUCAP 208* 137* 145* 139* 165*   Cardiac Enzymes: No results for input(s): CKTOTAL, CKMB, CKMBINDEX, TROPONINI in the last 168 hours. No results for input(s): PROBNP in the last 8760 hours. Coagulation Profile: No results for input(s): INR, PROTIME in the last 168 hours. Thyroid Function Tests: No results for input(s): TSH, T4TOTAL, FREET4, T3FREE, THYROIDAB in the last 72 hours. Lipid Profile: No results for input(s): CHOL, HDL, LDLCALC, TRIG, CHOLHDL, LDLDIRECT in the last 72 hours. Anemia Panel: No results for input(s): VITAMINB12, FOLATE, FERRITIN, TIBC, IRON, RETICCTPCT in the last 72 hours. Urine analysis:    Component Value Date/Time   COLORURINE AMBER (A) 07/22/2019 1535   APPEARANCEUR HAZY (A) 07/22/2019 1535   LABSPEC 1.027 07/22/2019 1535   PHURINE 5.0 07/22/2019 1535   GLUCOSEU NEGATIVE 07/22/2019 1535   HGBUR LARGE (A) 07/22/2019 1535   BILIRUBINUR NEGATIVE 07/22/2019 1535   KETONESUR 5 (A) 07/22/2019 1535   PROTEINUR 100 (A) 07/22/2019 1535   NITRITE NEGATIVE 07/22/2019 1535   LEUKOCYTESUR TRACE (A) 07/22/2019 1535   Sepsis Labs: Invalid input(s): PROCALCITONIN, LACTICIDVEN  Microbiology: Recent Results (from the past 240 hour(s))  Respiratory Panel by RT PCR (Flu A&B, Covid) - Nasopharyngeal Swab     Status: None   Collection Time:  07/21/19  4:00 AM   Specimen: Nasopharyngeal Swab  Result Value Ref Range Status   SARS Coronavirus 2 by RT PCR NEGATIVE NEGATIVE Final    Comment: (NOTE) SARS-CoV-2 target nucleic acids are NOT DETECTED. The SARS-CoV-2 RNA is generally detectable in upper respiratoy specimens during the acute phase of infection. The lowest concentration of SARS-CoV-2 viral copies this assay can detect is 131 copies/mL. A  negative result does not preclude SARS-Cov-2 infection and should not be used as the sole basis for treatment or other patient management decisions. A negative result may occur with  improper specimen collection/handling, submission of specimen other than nasopharyngeal swab, presence of viral mutation(s) within the areas targeted by this assay, and inadequate number of viral copies (<131 copies/mL). A negative result must be combined with clinical observations, patient history, and epidemiological information. The expected result is Negative. Fact Sheet for Patients:  https://www.moore.com/ Fact Sheet for Healthcare Providers:  https://www.young.biz/ This test is not yet ap proved or cleared by the [place] FDA and  has been authorized for detection and/or diagnosis of SARS-CoV-2 by FDA under an Emergency Use Authorization (EUA). This EUA will remain  in effect (meaning this test can be used) for the duration of the COVID-19 declaration under Section 564(b)(1) of the Act, 21 U.S.C. section 360bbb-3(b)(1), unless the authorization is terminated or revoked sooner.    Influenza A by PCR NEGATIVE NEGATIVE Final   Influenza B by PCR NEGATIVE NEGATIVE Final    Comment: (NOTE) The Xpert Xpress SARS-CoV-2/FLU/RSV assay is intended as an aid in  the diagnosis of influenza from Nasopharyngeal swab specimens and  should not be used as a sole basis for treatment. Nasal washings and  aspirates are unacceptable for Xpert Xpress SARS-CoV-2/FLU/RSV   testing. Fact Sheet for Patients: https://www.moore.com/ Fact Sheet for Healthcare Providers: https://www.young.biz/ This test is not yet approved or cleared by the [place] FDA and  has been authorized for detection and/or diagnosis of SARS-CoV-2 by  FDA under an Emergency Use Authorization (EUA). This EUA will remain  in effect (meaning this test can be used) for the duration of the  Covid-19 declaration under Section 564(b)(1) of the Act, 21  U.S.C. section 360bbb-3(b)(1), unless the authorization is  terminated or revoked. Performed at [facility], [address] [place], [place] [address]   Surgical PCR screen     Status: Abnormal   Collection Time: 07/22/19  5:24 AM   Specimen: Nasal Mucosa; Nasal Swab  Result Value Ref Range Status   MRSA, PCR POSITIVE (A) NEGATIVE Final    Comment: RESULT CALLED TO, READ BACK BY AND VERIFIED WITH: RN Cheri Guppy [phone] FCP    Staphylococcus aureus POSITIVE (A) NEGATIVE Final    Comment: (NOTE) The Xpert SA Assay (FDA approved for NASAL specimens in patients 40 years of age and older), is one component of a comprehensive surveillance program. It is not intended to diagnose infection nor to guide or monitor treatment. Performed at [facility], [address], [place], [place] [address]   Culture, Urine     Status: None   Collection Time: 07/22/19  2:23 PM   Specimen: Urine, Catheterized  Result Value Ref Range Status   Specimen Description URINE, CATHETERIZED  Final   Special Requests NONE  Final   Culture   Final    NO GROWTH Performed at [facility], [address], [place], [place] [address]    Report Status 07/23/2019 FINAL  Final  Culture, blood (routine x 2)     Status: None   Collection Time: 07/22/19  3:29 PM   Specimen: BLOOD LEFT HAND  Result Value Ref Range Status   Specimen Description BLOOD LEFT HAND  Final   Special Requests   Final    BOTTLES  DRAWN AEROBIC ONLY Blood Culture results may not be optimal due to an inadequate volume of blood  received in culture bottles   Culture   Final    NO GROWTH 5 DAYS Performed at [facility], [address], [place], [place] [address]    Report Status 07/27/2019 FINAL  Final  Culture, blood (routine x 2)     Status: None   Collection Time: 07/22/19  3:37 PM   Specimen: BLOOD RIGHT HAND  Result Value Ref Range Status   Specimen Description BLOOD RIGHT HAND  Final   Special Requests   Final    BOTTLES DRAWN AEROBIC ONLY Blood Culture results may not be optimal due to an inadequate volume of blood received in culture bottles   Culture   Final    NO GROWTH 5 DAYS Performed at [facility], [address], [place], [place] [address]    Report Status 07/27/2019 FINAL  Final  Surgical pcr screen     Status: None   Collection Time: 07/26/19 11:46 AM   Specimen: Nasal Mucosa; Nasal Swab  Result Value Ref Range Status   MRSA, PCR NEGATIVE NEGATIVE Final   Staphylococcus aureus NEGATIVE NEGATIVE Final    Comment: (NOTE) The Xpert SA Assay (FDA approved for NASAL specimens in patients 68 years of age and older), is one component of a comprehensive surveillance program. It is not intended to diagnose infection nor to guide or monitor treatment. Performed at [facility], [address] [place], [place] [address]   SARS CORONAVIRUS 2 (TAT 6-24 HRS) Nasopharyngeal Nasopharyngeal Swab     Status: None   Collection Time: 07/27/19  3:30 PM   Specimen: Nasopharyngeal Swab  Result Value Ref Range Status   SARS Coronavirus 2 NEGATIVE NEGATIVE Final    Comment: (NOTE) SARS-CoV-2 target nucleic acids are NOT DETECTED. The SARS-CoV-2 RNA is generally detectable in upper and lower respiratory specimens during the acute phase of infection. Negative results do not preclude SARS-CoV-2 infection, do not rule out co-infections with other pathogens, and should not be used as the sole  basis for treatment or other patient management decisions. Negative results must be combined with clinical observations, patient history, and epidemiological information. The expected result is Negative. Fact Sheet for Patients: HairSlick.no Fact Sheet for Healthcare Providers: quierodirigir.com This test is not yet approved or cleared by the [place] FDA and  has been authorized for detection and/or diagnosis of SARS-CoV-2 by FDA under an Emergency Use Authorization (EUA). This EUA will remain  in effect (meaning this test can be used) for the duration of the COVID-19 declaration under Section 56 4(b)(1) of the Act, 21 U.S.C. section 360bbb-3(b)(1), unless the authorization is terminated or revoked sooner. Performed at [facility], [address] [place], [place] [address]     Radiology Studies: No results found.  45 minutes with more than 50% spent in reviewing records, counseling patient/family and coordinating care.   Wanda Rideout T. [facility]  If 7PM-7AM, please contact night-coverage [email or web address] Password [facility] 07/28/2019, 3:46 PM

## 2019-07-28 NOTE — TOC Progression Note (Signed)
Transition of Care (TOC) - Progression Note    Patient Details  Name: Nicholas BEDEL Sr. MRN: [id number] Date of Birth: 06/30/1928  Transition of Care [facility]) CM/SW Contact  Eduard Roux, [place] Phone Number: 07/28/2019, 12:44 PM  Clinical Narrative:     CSW contacted [facility] to confirm discharge plan. CSW waiting on response.  Antony Blackbird, MSW, LCSWA Clinical Social Worker   Expected Discharge Plan: Assisted Living Barriers to Discharge: Continued Medical Work up  Expected Discharge Plan and Services Expected Discharge Plan: Assisted Living       Living arrangements for the past 2 months: Assisted Living Facility([facility])                                       Social Determinants of Health (SDOH) Interventions    Readmission Risk Interventions No flowsheet data found.

## 2019-07-29 ENCOUNTER — Encounter: Payer: Self-pay | Admitting: *Deleted

## 2019-07-29 DIAGNOSIS — E876 Hypokalemia: Secondary | ICD-10-CM

## 2019-07-29 LAB — RENAL FUNCTION PANEL
Albumin: 3 g/dL — ABNORMAL LOW (ref 3.5–5.0)
Anion gap: 13 (ref 5–15)
BUN: 52 mg/dL — ABNORMAL HIGH (ref 8–23)
CO2: 30 mmol/L (ref 22–32)
Calcium: 9.1 mg/dL (ref 8.9–10.3)
Chloride: 111 mmol/L (ref 98–111)
Creatinine, Ser: 1.02 mg/dL (ref 0.61–1.24)
GFR calc Af Amer: >60 mL/min (ref >60)
GFR calc non Af Amer: >60 mL/min (ref >60)
Glucose, Bld: 179 mg/dL — ABNORMAL HIGH (ref 70–99)
Phosphorus: 2.1 mg/dL — ABNORMAL LOW (ref 2.5–4.6)
Potassium: 3.4 mmol/L — ABNORMAL LOW (ref 3.5–5.1)
Sodium: 154 mmol/L — ABNORMAL HIGH (ref 135–145)

## 2019-07-29 LAB — GLUCOSE, CAPILLARY
Glucose-Capillary: 143 mg/dL — ABNORMAL HIGH (ref 70–99)
Glucose-Capillary: 148 mg/dL — ABNORMAL HIGH (ref 70–99)
Glucose-Capillary: 182 mg/dL — ABNORMAL HIGH (ref 70–99)
Glucose-Capillary: 191 mg/dL — ABNORMAL HIGH (ref 70–99)
Glucose-Capillary: 202 mg/dL — ABNORMAL HIGH (ref 70–99)
Glucose-Capillary: 227 mg/dL — ABNORMAL HIGH (ref 70–99)
Glucose-Capillary: 99 mg/dL (ref 70–99)

## 2019-07-29 LAB — CBC
HCT: 47.4 % (ref 39.0–52.0)
Hemoglobin: 15.4 g/dL (ref 13.0–17.0)
MCH: 32.2 pg (ref 26.0–34.0)
MCHC: 32.5 g/dL (ref 30.0–36.0)
MCV: 99.2 fL (ref 80.0–100.0)
Platelets: 107 10*3/uL — ABNORMAL LOW (ref 150–400)
RBC: 4.78 MIL/uL (ref 4.22–5.81)
RDW: 14.6 % (ref 11.5–15.5)
WBC: 14 10*3/uL — ABNORMAL HIGH (ref 4.0–10.5)
nRBC: 0 % (ref 0.0–0.2)

## 2019-07-29 LAB — HEPATIC FUNCTION PANEL
ALT: 36 U/L (ref 0–44)
AST: 25 U/L (ref 15–41)
Albumin: 2.9 g/dL — ABNORMAL LOW (ref 3.5–5.0)
Alkaline Phosphatase: 73 U/L (ref 38–126)
Bilirubin, Direct: 0.4 mg/dL — ABNORMAL HIGH (ref 0.0–0.2)
Indirect Bilirubin: 1.7 mg/dL — ABNORMAL HIGH (ref 0.3–0.9)
Total Bilirubin: 2.1 mg/dL — ABNORMAL HIGH (ref 0.3–1.2)
Total Protein: 5.9 g/dL — ABNORMAL LOW (ref 6.5–8.1)

## 2019-07-29 LAB — RPR: RPR Ser Ql: NONREACTIVE

## 2019-07-29 LAB — MAGNESIUM: Magnesium: 2.2 mg/dL (ref 1.7–2.4)

## 2019-07-29 LAB — CORTISOL-AM, BLOOD: Cortisol - AM: 30.1 ug/dL — ABNORMAL HIGH (ref 6.7–22.6)

## 2019-07-29 MED ORDER — SPIRONOLACTONE 25 MG PO TABS
50.0000 mg | ORAL_TABLET | Freq: Every day | ORAL | Status: DC
  Administered 2019-07-29 – 2019-07-30 (×2): 50 mg via ORAL
  Filled 2019-07-29 (×2): qty 2

## 2019-07-29 MED ORDER — DEXTROSE 5 % IV SOLN: INTRAVENOUS | Status: DC

## 2019-07-29 MED ORDER — POTASSIUM CHLORIDE CRYS ER 20 MEQ PO TBCR
40.0000 meq | EXTENDED_RELEASE_TABLET | Freq: Once | ORAL | Status: AC
  Administered 2019-07-29: 09:00:00 40 meq via ORAL
  Filled 2019-07-29: qty 2

## 2019-07-29 MED ORDER — INSULIN ASPART 100 UNIT/ML ~~LOC~~ SOLN
0.0000 [IU] | Freq: Three times a day (TID) | SUBCUTANEOUS | Status: DC
  Administered 2019-07-30 (×2): 2 [IU] via SUBCUTANEOUS
  Administered 2019-07-30 – 2019-07-31 (×3): 3 [IU] via SUBCUTANEOUS
  Administered 2019-07-31: 13:00:00 5 [IU] via SUBCUTANEOUS
  Administered 2019-08-01 (×2): 3 [IU] via SUBCUTANEOUS

## 2019-07-29 MED ORDER — METOPROLOL TARTRATE 25 MG PO TABS
25.0000 mg | ORAL_TABLET | Freq: Two times a day (BID) | ORAL | Status: DC
  Administered 2019-07-29 – 2019-07-30 (×3): 25 mg via ORAL
  Filled 2019-07-29 (×3): qty 1

## 2019-07-29 MED ORDER — INSULIN ASPART 100 UNIT/ML ~~LOC~~ SOLN
0.0000 [IU] | Freq: Every day | SUBCUTANEOUS | Status: DC
  Administered 2019-07-30: 2 [IU] via SUBCUTANEOUS

## 2019-07-29 NOTE — Progress Notes (Addendum)
Telemetry notifies staff nurse about patient's 4 beat run of multi focal PVCs. Patient found in bed confused x 4. Vitals signs obtained and recorded. Will notify on call provider in regards to most recent telemetry changes. Also informed on call provider that a nasal swab (Covid 19) is needed  per SNF placement.   MD Communication 07/29/19 @ 0240: On call provider notified that physicians  will have to place order for Covid 19 swab per [facility] policy. Nursing staff are not permitted

## 2019-07-29 NOTE — Progress Notes (Signed)
PROGRESS NOTE  Nicholas KERWOOD Sr. GNF:[id number] DOB: [birth year]   PCP: Patient, No Pcp Per  Patient is from: Nursing facility  DOA: 07/21/2019 LOS: 8  Brief Narrative / Interim history: 86 year old male with history of alcohol abuse, CAD/stent, DM-2, recent compression fracture 3 to 4 months ago brought to ED from facility after unwitnessed fall and right femoral fracture.   Patient also hospitalized for compression fracture at [facility] and started to rehab 3 to 4 months ago. Underwent ORIF on 07/26/2019.  Also concern about HCAP based on initial chest x-ray although this was later thought to be due to pulmonary edema.  He completed empiric antibiotic with cefepime for 5 days.  Hospital course complicated by new A. Fib and new systolic CHF with EF of 25 to 84%.  Cardiology consulted.   Subjective: PVCs on telemetry.  Otherwise, no major events overnight or this morning.  Awake but confused.  Not able to provide history.  Eating his breakfast with assistance of nurse tech.  Does not appear to be in distress.  Objective: Vitals:   07/29/19 0120 07/29/19 0404 07/29/19 0625 07/29/19 0807  BP: 129/74 (!) 149/72 124/83 111/77  Pulse: 99 (!) 102 (!) 106   Resp: (!) 25 20 20    Temp:  99.5 F (37.5 C)  98.2 F (36.8 C)  TempSrc:  Axillary  Axillary  SpO2:  94%    Weight:      Height:        Intake/Output Summary (Last 24 hours) at 07/29/2019 1525 Last data filed at 07/29/2019 0941 Gross per 24 hour  Intake 1542.78 ml  Output 800 ml  Net 742.78 ml   Filed Weights   07/26/19 0338 07/27/19 0406 07/28/19 0411  Weight: 78.9 kg 82.6 kg 78.5 kg    Examination:  GENERAL: Chronically ill-appearing.  No apparent distress. HEENT: MMM.  Vision and hearing grossly intact.  NECK: Supple.  No apparent JVD.  RESP:  No IWOB. Good air movement bilaterally. CVS: HR in 100s.Marland Kitchen Heart sounds normal.  ABD/GI/GU: Bowel sounds present. Soft. Non tender.  MSK/EXT: Surgical dressing over  right hip clean, dry and intact. SKIN: no apparent skin lesion or wound NEURO: Awake, alert but not able to answer orientation questions.  Follows some commands.  Moves upper extremities. PSYCH: Calm.  No distress or agitation.  Procedures:  2/15-prosthetic replacement for femoral neck fracture by Dr. Roda Shutters  Assessment & Plan: Fall at nursing home Closed displaced fracture of right femoral neck -Status post prosthetic replacement of femoral neck fracture on 2/15 as above -Orthopedic surgery recs-therapy, WBAT, subcu Lovenox for 2 weeks, f/up with Dr. Roda Shutters in two weeks -Continue PT/OT  Acute respiratory failure with hypoxia-likely due to CHF exacerbation and possibly HCAP. -Empirically treated with cefepime for 5 days although infectious process thought to be less likely -Manage CHF as below.  Acute on chronic systolic CHF: Echo with EF of 25 to 30%, global hypokinesis, severe LAE, moderate RAE, normal RVSF.  INR incomplete.  800 cc overnight.  Renal function stable.  Appears euvolemic. -Continue holding Lasix in the setting of hypernatremia and azotemia. -Increased Aldactone to 50 mg -Monitor fluid status, renal function and electrolytes. -Cardiology signed off.  New onset atrial fibrillation: HR in the range of 110-130.  CHA2DS2-VASc score 5 but not a good candidate for anticoagulation due to recurrent fall. -Switch to oral metoprolol 25 mg twice daily.  Acute metabolic encephalopathy in patient with history of dementia: Oriented to self only.  Reportedly has  some baseline confusion.  B12 was normal.  RPR nonreactive.  Cortisol appropriately high.  TSH slightly high.  Free T4 normal.  Ammonia 37. -Continue high-dose thiamine empirically given history of alcohol abuse 2/17>> -Follow thiamine level -Continue home memantine -Low-dose lactulose.  Hyperbilirubinemia: Sullivan LoneGilbert syndrome?  H&H stable. -Continue monitoring.  Hypernatremia: Na 154> 154 -Increase D5 infusion to 125 cc an hour.  -Monitor sodium level.  Hypokalemia -Replenish -Increase Aldactone to 50 mg daily  Alcohol use disorder: no EtOH for the last three months  Sepsis secondary to HCAP: POA -Completed antibiotic course as above.  Uncontrolled DM-2 with hyperglycemia Recent Labs    07/29/19 0402 07/29/19 0802 07/29/19 1241  GLUCAP 143* 182* 202*  -Increase SSI to moderate.  Essential hypertension: BP within fair range. -Lopressor as above.  BPH with acute urinary retention-resolved.  Leukocytosis/bandemia: Likely demargination.  Improved. -Continue monitoring  Thrombocytopenia: Relatively stable -Continue monitoring       Nutrition Problem: Increased nutrient needs Etiology: hip fracture  Signs/Symptoms: estimated needs  Interventions: Ensure Enlive (each supplement provides 350kcal and 20 grams of protein), Juven, Prostat, Magic cup   DVT prophylaxis: Subcu Lovenox Code Status: DNR/DNI Family Communication: Updated patient's son over the phone on 2/17.  Appreciative about his father's care.  Discharge barrier: Encephalopathy and hypernatremia Patient is from: Nursing home Final disposition: SNF when medically stable, hopefully in the next 24 to 48 hours.  Consultants: Orthopedic surgery and cardiology   Microbiology summarized: COVID-19 negative  blood cultures negative  urine cultures negative  Sch Meds:  Scheduled Meds: . Chlorhexidine Gluconate Cloth  6 each Topical Daily  . docusate sodium  100 mg Oral BID  . enoxaparin (LOVENOX) injection  40 mg Subcutaneous Q24H  . feeding supplement (ENSURE ENLIVE)  237 mL Oral TID WC  . feeding supplement (PRO-STAT SUGAR FREE 64)  30 mL Oral TID  . insulin aspart  0-5 Units Subcutaneous QHS  . insulin aspart  0-9 Units Subcutaneous TID WC  . lidocaine  1 patch Transdermal QHS  . metoprolol tartrate  25 mg Oral BID  . nutrition supplement (JUVEN)  1 packet Oral BID BM  . spironolactone  50 mg Oral Daily   Continuous  Infusions: . methocarbamol (ROBAXIN) IV    . thiamine injection 250 mg (07/28/19 1836)   PRN Meds:.acetaminophen, alum & mag hydroxide-simeth, HYDROcodone-acetaminophen, HYDROcodone-acetaminophen, ipratropium-albuterol, LORazepam, magnesium citrate, menthol-cetylpyridinium **OR** phenol, methocarbamol **OR** methocarbamol (ROBAXIN) IV, morphine injection, ondansetron **OR** ondansetron (ZOFRAN) IV, polyethylene glycol, sorbitol  Antimicrobials: Anti-infectives (From admission, onward)   Start     Dose/Rate Route Frequency Ordered Stop   07/26/19 1845  ceFAZolin (ANCEF) IVPB 2g/100 mL premix  Status:  Discontinued     2 g 200 mL/hr over 30 Minutes Intravenous Every 6 hours 07/26/19 1844 07/26/19 1855   07/26/19 1544  vancomycin (VANCOCIN) powder  Status:  Discontinued       As needed 07/26/19 1544 07/26/19 1648   07/26/19 1351  ceFAZolin (ANCEF) 2-4 GM/100ML-% IVPB    Note to Pharmacy: Tawanna Sathang, Connie   : cabinet override      07/26/19 1351 07/27/19 0159   07/26/19 0600  ceFAZolin (ANCEF) IVPB 2g/100 mL premix    Note to Pharmacy: Anesthesia to give preop   2 g 200 mL/hr over 30 Minutes Intravenous  Once 07/25/19 2013 07/26/19 0650   07/22/19 1445  vancomycin (VANCOREADY) IVPB 1500 mg/300 mL  Status:  Discontinued     1,500 mg 150 mL/hr over 120 Minutes Intravenous Every 24  hours 07/22/19 1433 07/25/19 1239   07/22/19 1430  ceFEPIme (MAXIPIME) 2 g in sodium chloride 0.9 % 100 mL IVPB  Status:  Discontinued     2 g 200 mL/hr over 30 Minutes Intravenous Every 12 hours 07/22/19 1427 07/27/19 0839   07/22/19 0600  ceFAZolin (ANCEF) IVPB 2g/100 mL premix  Status:  Discontinued     2 g 200 mL/hr over 30 Minutes Intravenous On call to O.R. 07/21/19 1620 07/22/19 1359       I have personally reviewed the following labs and images: CBC: Recent Labs  Lab 07/25/19 0353 07/26/19 0334 07/27/19 0447 07/28/19 0346 07/29/19 0444  WBC 13.3* 11.5* 14.5* 17.0* 14.0*  HGB 15.5 15.7 15.1 15.4  15.4  HCT 47.2 48.2 46.3 47.6 47.4  MCV 96.9 99.6 100.4* 101.7* 99.2  PLT 152 146* 114* 119* 107*   BMP &GFR Recent Labs  Lab 07/22/19 1529 07/23/19 0357 07/25/19 0353 07/26/19 0334 07/27/19 0447 07/28/19 0346 07/29/19 0444  NA 141   < > 147* 152* 151* 154* 154*  K 4.0   < > 3.2* 3.7 3.5 3.4* 3.4*  CL 100   < > 103 106 106 106 111  CO2 27   < > 33* 32 32 34* 30  GLUCOSE 149*   < > 145* 142* 177* 143* 179*  BUN 34*   < > 36* 42* 37* 43* 52*  CREATININE 1.16   < > 0.94 1.02 0.80 0.89 1.02  CALCIUM 9.3   < > 9.0 9.0 8.8* 9.2 9.1  MG 2.0  --  2.0 2.2  --   --  2.2  PHOS  --   --  3.2 3.3  --   --  2.1*   < > = values in this interval not displayed.   Estimated Creatinine Clearance: 54.9 mL/min (by C-G formula based on SCr of 1.02 mg/dL). Liver & Pancreas: Recent Labs  Lab 07/25/19 0353 07/26/19 0334 07/29/19 0444  AST 74* 33 25  ALT 127* 82* 36  ALKPHOS 91 82 73  BILITOT 2.0* 2.1* 2.1*  PROT 5.7* 5.5* 5.9*  ALBUMIN 3.0* 2.9* 2.9*  3.0*   No results for input(s): LIPASE, AMYLASE in the last 168 hours. Recent Labs  Lab 07/28/19 1626  AMMONIA 37*   Diabetic: No results for input(s): HGBA1C in the last 72 hours. Recent Labs  Lab 07/28/19 2014 07/28/19 2344 07/29/19 0402 07/29/19 0802 07/29/19 1241  GLUCAP 236* 227* 143* 182* 202*   Cardiac Enzymes: No results for input(s): CKTOTAL, CKMB, CKMBINDEX, TROPONINI in the last 168 hours. No results for input(s): PROBNP in the last 8760 hours. Coagulation Profile: No results for input(s): INR, PROTIME in the last 168 hours. Thyroid Function Tests: Recent Labs    07/28/19 1626  FREET4 0.99   Lipid Profile: No results for input(s): CHOL, HDL, LDLCALC, TRIG, CHOLHDL, LDLDIRECT in the last 72 hours. Anemia Panel: Recent Labs    07/28/19 1626  VITAMINB12 425   Urine analysis:    Component Value Date/Time   COLORURINE AMBER (A) 07/22/2019 1535   APPEARANCEUR HAZY (A) 07/22/2019 1535   LABSPEC 1.027  07/22/2019 1535   PHURINE 5.0 07/22/2019 1535   GLUCOSEU NEGATIVE 07/22/2019 1535   HGBUR LARGE (A) 07/22/2019 1535   BILIRUBINUR NEGATIVE 07/22/2019 1535   KETONESUR 5 (A) 07/22/2019 1535   PROTEINUR 100 (A) 07/22/2019 1535   NITRITE NEGATIVE 07/22/2019 1535   LEUKOCYTESUR TRACE (A) 07/22/2019 1535   Sepsis Labs: Invalid input(s): PROCALCITONIN, LACTICIDVEN  Microbiology: Recent Results (from the past 240 hour(s))  Respiratory Panel by RT PCR (Flu A&B, Covid) - Nasopharyngeal Swab     Status: None   Collection Time: 07/21/19  4:00 AM   Specimen: Nasopharyngeal Swab  Result Value Ref Range Status   SARS Coronavirus 2 by RT PCR NEGATIVE NEGATIVE Final    Comment: (NOTE) SARS-CoV-2 target nucleic acids are NOT DETECTED. The SARS-CoV-2 RNA is generally detectable in upper respiratoy specimens during the acute phase of infection. The lowest concentration of SARS-CoV-2 viral copies this assay can detect is 131 copies/mL. A negative result does not preclude SARS-Cov-2 infection and should not be used as the sole basis for treatment or other patient management decisions. A negative result may occur with  improper specimen collection/handling, submission of specimen other than nasopharyngeal swab, presence of viral mutation(s) within the areas targeted by this assay, and inadequate number of viral copies (<131 copies/mL). A negative result must be combined with clinical observations, patient history, and epidemiological information. The expected result is Negative. Fact Sheet for Patients:  https://www.moore.com/ Fact Sheet for Healthcare Providers:  https://www.young.biz/ This test is not yet ap proved or cleared by the [place] FDA and  has been authorized for detection and/or diagnosis of SARS-CoV-2 by FDA under an Emergency Use Authorization (EUA). This EUA will remain  in effect (meaning this test can be used) for the duration of the  COVID-19 declaration under Section 564(b)(1) of the Act, 21 U.S.C. section 360bbb-3(b)(1), unless the authorization is terminated or revoked sooner.    Influenza A by PCR NEGATIVE NEGATIVE Final   Influenza B by PCR NEGATIVE NEGATIVE Final    Comment: (NOTE) The Xpert Xpress SARS-CoV-2/FLU/RSV assay is intended as an aid in  the diagnosis of influenza from Nasopharyngeal swab specimens and  should not be used as a sole basis for treatment. Nasal washings and  aspirates are unacceptable for Xpert Xpress SARS-CoV-2/FLU/RSV  testing. Fact Sheet for Patients: https://www.moore.com/ Fact Sheet for Healthcare Providers: https://www.young.biz/ This test is not yet approved or cleared by the [place] FDA and  has been authorized for detection and/or diagnosis of SARS-CoV-2 by  FDA under an Emergency Use Authorization (EUA). This EUA will remain  in effect (meaning this test can be used) for the duration of the  Covid-19 declaration under Section 564(b)(1) of the Act, 21  U.S.C. section 360bbb-3(b)(1), unless the authorization is  terminated or revoked. Performed at [facility], [address] [place], [place] [address]   Surgical PCR screen     Status: Abnormal   Collection Time: 07/22/19  5:24 AM   Specimen: Nasal Mucosa; Nasal Swab  Result Value Ref Range Status   MRSA, PCR POSITIVE (A) NEGATIVE Final    Comment: RESULT CALLED TO, READ BACK BY AND VERIFIED WITH: RN Cheri Guppy [phone] FCP    Staphylococcus aureus POSITIVE (A) NEGATIVE Final    Comment: (NOTE) The Xpert SA Assay (FDA approved for NASAL specimens in patients 58 years of age and older), is one component of a comprehensive surveillance program. It is not intended to diagnose infection nor to guide or monitor treatment. Performed at [facility], [address] [place], [place] [address]   Culture, Urine     Status: None   Collection Time: 07/22/19  2:23 PM    Specimen: Urine, Catheterized  Result Value Ref Range Status   Specimen Description URINE, CATHETERIZED  Final   Special Requests NONE  Final   Culture  Final    NO GROWTH Performed at [facility], [address], [place], [place] [address]    Report Status 07/23/2019 FINAL  Final  Culture, blood (routine x 2)     Status: None   Collection Time: 07/22/19  3:29 PM   Specimen: BLOOD LEFT HAND  Result Value Ref Range Status   Specimen Description BLOOD LEFT HAND  Final   Special Requests   Final    BOTTLES DRAWN AEROBIC ONLY Blood Culture results may not be optimal due to an inadequate volume of blood received in culture bottles   Culture   Final    NO GROWTH 5 DAYS Performed at [facility], [address] [place], [place] [address]    Report Status 07/27/2019 FINAL  Final  Culture, blood (routine x 2)     Status: None   Collection Time: 07/22/19  3:37 PM   Specimen: BLOOD RIGHT HAND  Result Value Ref Range Status   Specimen Description BLOOD RIGHT HAND  Final   Special Requests   Final    BOTTLES DRAWN AEROBIC ONLY Blood Culture results may not be optimal due to an inadequate volume of blood received in culture bottles   Culture   Final    NO GROWTH 5 DAYS Performed at [facility], [address] [place], [place] [address]    Report Status 07/27/2019 FINAL  Final  Surgical pcr screen     Status: None   Collection Time: 07/26/19 11:46 AM   Specimen: Nasal Mucosa; Nasal Swab  Result Value Ref Range Status   MRSA, PCR NEGATIVE NEGATIVE Final   Staphylococcus aureus NEGATIVE NEGATIVE Final    Comment: (NOTE) The Xpert SA Assay (FDA approved for NASAL specimens in patients 36 years of age and older), is one component of a comprehensive surveillance program. It is not intended to diagnose infection nor to guide or monitor treatment. Performed at [facility], [address], [place], [place] [address]   SARS CORONAVIRUS 2 (TAT 6-24 HRS)  Nasopharyngeal Nasopharyngeal Swab     Status: None   Collection Time: 07/27/19  3:30 PM   Specimen: Nasopharyngeal Swab  Result Value Ref Range Status   SARS Coronavirus 2 NEGATIVE NEGATIVE Final    Comment: (NOTE) SARS-CoV-2 target nucleic acids are NOT DETECTED. The SARS-CoV-2 RNA is generally detectable in upper and lower respiratory specimens during the acute phase of infection. Negative results do not preclude SARS-CoV-2 infection, do not rule out co-infections with other pathogens, and should not be used as the sole basis for treatment or other patient management decisions. Negative results must be combined with clinical observations, patient history, and epidemiological information. The expected result is Negative. Fact Sheet for Patients: HairSlick.no Fact Sheet for Healthcare Providers: quierodirigir.com This test is not yet approved or cleared by the [place] FDA and  has been authorized for detection and/or diagnosis of SARS-CoV-2 by FDA under an Emergency Use Authorization (EUA). This EUA will remain  in effect (meaning this test can be used) for the duration of the COVID-19 declaration under Section 56 4(b)(1) of the Act, 21 U.S.C. section 360bbb-3(b)(1), unless the authorization is terminated or revoked sooner. Performed at [facility], [address], [place], [place] [address]     Radiology Studies: No results found.  45 minutes with more than 50% spent in reviewing records, counseling patient/family and coordinating care.    T.  [facility]  If 7PM-7AM, please contact  night-coverage [email or web address] Password [facility] 07/29/2019, 3:25 PM

## 2019-07-29 NOTE — Progress Notes (Signed)
Physical Therapy Treatment Patient Details Name: Nicholas PELCHER Sr. MRN: [id number] DOB: 1929-01-20 Today's Date: 07/29/2019    History of Present Illness Nicholas NILSON TABORA Sr. is a 86 y.o. male with history of alcohol abuse previous history of diabetes CAD status post stenting many years ago with history of alcohol abuse who had a compression fracture and has been in nursing facility since.  He had an unwitnessed fall at the nursing facility admitted with R FNF.  Had pulmonary edema and new onset a-fib w/RVR on admission 2/10 so had medical treatment prior to surgery.  Now s/p R hip hemiarthroplasty anterior approach on 2/15.    PT Comments    Patient received in bed, very lethargic and not responding to verbal or tactile stimulation; eventually able to get localized response to pain with sternal rub. Required totalAx2 for all attempts at bed mobility, then limited first by lethargy then physical resistance increasing to agitation and he began to try to push PT away and grab at therapist's waist but unable to really do so due to mitts. Seems to have possible cervical contracture- difficult to assess as he kept resisting any passive movements of head and neck.  He was left in bed positioned to comfort with all needs met, bed alarm active. Dropped frequency to 2x/week, can certainly increase it again if his lethargy and agitation with mobility improve.    Follow Up Recommendations  SNF;Supervision/Assistance - 24 hour     Equipment Recommendations  None recommended by PT    Recommendations for Other Services       Precautions / Restrictions Precautions Precautions: Fall Restrictions Weight Bearing Restrictions: No RLE Weight Bearing: Weight bearing as tolerated    Mobility  Bed Mobility Overal bed mobility: Needs Assistance Bed Mobility: Supine to Sit;Sit to Supine     Supine to sit: Total assist;+2 for physical assistance Sit to supine: Total assist;+2 for physical assistance    General bed mobility comments: unable to effectively get to EOB even with totalAx2 due to physical resistance and strong posterior lean  Transfers                 General transfer comment: deferred   Ambulation/Gait             General Gait Details: unable   Stairs             Wheelchair Mobility    Modified Rankin (Stroke Patients Only)       Balance Overall balance assessment: Needs assistance Sitting-balance support: Feet supported Sitting balance-Leahy Scale: Poor Sitting balance - Comments: extremely strong posterior lean and physical resistance, unable to even get to EOB today                                    Cognition Arousal/Alertness: Lethargic Behavior During Therapy: Flat affect;Agitated Overall Cognitive Status: History of cognitive impairments - at baseline Area of Impairment: Orientation;Attention;Memory;Following commands;Safety/judgement;Problem solving;Awareness                 Orientation Level: Disoriented to;Person;Place;Time;Situation Current Attention Level: Focused   Following Commands: Follows one step commands inconsistently Safety/Judgement: Decreased awareness of safety Awareness: Intellectual Problem Solving: Difficulty sequencing;Requires verbal cues;Requires tactile cues;Decreased initiation;Slow processing General Comments: patient unable to follow commands, attend to tasks; known hx of dementia. Very lethargic during first part of today's session then began to become agitated, pushing PT away and trying to grab  therapist's waist (but had posey mitts so could not)      Exercises      General Comments        Pertinent Vitals/Pain Pain Assessment: Faces Faces Pain Scale: Hurts whole lot Pain Location: R hip with movement Pain Descriptors / Indicators: Grimacing;Moaning;Discomfort Pain Intervention(s): Limited activity within patient's tolerance;Monitored during session;Repositioned     Home Living                      Prior Function            PT Goals (current goals can now be found in the care plan section) Acute Rehab PT Goals Patient Stated Goal: none stated PT Goal Formulation: Patient unable to participate in goal setting Time For Goal Achievement: 08/10/19 Potential to Achieve Goals: Fair Progress towards PT goals: Not progressing toward goals - comment(limited by agitation and lethargy, physical resistance)    Frequency    Min 2X/week      PT Plan Frequency needs to be updated    Co-evaluation              AM-PAC PT "6 Clicks" Mobility   Outcome Measure  Help needed turning from your back to your side while in a flat bed without using bedrails?: Total Help needed moving from lying on your back to sitting on the side of a flat bed without using bedrails?: Total Help needed moving to and from a bed to a chair (including a wheelchair)?: Total Help needed standing up from a chair using your arms (e.g., wheelchair or bedside chair)?: Total Help needed to walk in hospital room?: Total Help needed climbing 3-5 steps with a railing? : Total 6 Click Score: 6    End of Session   Activity Tolerance: Treatment limited secondary to agitation Patient left: in bed;with call bell/phone within reach;with bed alarm set Nurse Communication: Mobility status;Other (comment)(lethargy followed by agitation) PT Visit Diagnosis: Other abnormalities of gait and mobility (R26.89);History of falling (Z91.81);Muscle weakness (generalized) (M62.81)     Time: 1164-3539 PT Time Calculation (min) (ACUTE ONLY): 15 min  Charges:  $Therapeutic Activity: 8-22 mins                     Windell Norfolk, DPT, PN1   Supplemental Physical Therapist [place]    Pager [phone] Acute Rehab Office [phone]

## 2019-07-30 ENCOUNTER — Inpatient Hospital Stay (HOSPITAL_COMMUNITY): Payer: Medicare Other

## 2019-07-30 DIAGNOSIS — N179 Acute kidney failure, unspecified: Secondary | ICD-10-CM

## 2019-07-30 LAB — RENAL FUNCTION PANEL
Albumin: 2.6 g/dL — ABNORMAL LOW (ref 3.5–5.0)
Anion gap: 10 (ref 5–15)
BUN: 70 mg/dL — ABNORMAL HIGH (ref 8–23)
CO2: 32 mmol/L (ref 22–32)
Calcium: 8.8 mg/dL — ABNORMAL LOW (ref 8.9–10.3)
Chloride: 111 mmol/L (ref 98–111)
Creatinine, Ser: 1.69 mg/dL — ABNORMAL HIGH (ref 0.61–1.24)
GFR calc Af Amer: 41 mL/min — ABNORMAL LOW (ref >60)
GFR calc non Af Amer: 35 mL/min — ABNORMAL LOW (ref >60)
Glucose, Bld: 240 mg/dL — ABNORMAL HIGH (ref 70–99)
Phosphorus: 2.6 mg/dL (ref 2.5–4.6)
Potassium: 3.1 mmol/L — ABNORMAL LOW (ref 3.5–5.1)
Sodium: 153 mmol/L — ABNORMAL HIGH (ref 135–145)

## 2019-07-30 LAB — GLUCOSE, CAPILLARY
Glucose-Capillary: 174 mg/dL — ABNORMAL HIGH (ref 70–99)
Glucose-Capillary: 186 mg/dL — ABNORMAL HIGH (ref 70–99)
Glucose-Capillary: 133 mg/dL — ABNORMAL HIGH (ref 70–99)
Glucose-Capillary: 227 mg/dL — ABNORMAL HIGH (ref 70–99)

## 2019-07-30 LAB — CBC
HCT: 50 % (ref 39.0–52.0)
Hemoglobin: 16.2 g/dL (ref 13.0–17.0)
MCH: 32.2 pg (ref 26.0–34.0)
MCHC: 32.4 g/dL (ref 30.0–36.0)
MCV: 99.4 fL (ref 80.0–100.0)
Platelets: 101 10*3/uL — ABNORMAL LOW (ref 150–400)
RBC: 5.03 MIL/uL (ref 4.22–5.81)
RDW: 14.7 % (ref 11.5–15.5)
WBC: 13.8 10*3/uL — ABNORMAL HIGH (ref 4.0–10.5)
nRBC: 0 % (ref 0.0–0.2)

## 2019-07-30 LAB — MAGNESIUM: Magnesium: 2.3 mg/dL (ref 1.7–2.4)

## 2019-07-30 MED ORDER — METOPROLOL TARTRATE 50 MG PO TABS
50.0000 mg | ORAL_TABLET | Freq: Two times a day (BID) | ORAL | Status: DC
  Administered 2019-07-30 – 2019-08-01 (×4): 50 mg via ORAL
  Filled 2019-07-30 (×4): qty 1

## 2019-07-30 MED ORDER — LACTULOSE 10 GM/15ML PO SOLN
20.0000 g | Freq: Three times a day (TID) | ORAL | Status: DC
  Administered 2019-07-30 – 2019-08-01 (×5): 20 g via ORAL
  Filled 2019-07-30 (×5): qty 30

## 2019-07-30 MED ORDER — ACETAMINOPHEN 500 MG PO TABS
1000.0000 mg | ORAL_TABLET | Freq: Three times a day (TID) | ORAL | Status: DC
  Administered 2019-07-30 – 2019-08-01 (×6): 1000 mg via ORAL
  Filled 2019-07-30 (×6): qty 2

## 2019-07-30 MED ORDER — POTASSIUM CHLORIDE CRYS ER 20 MEQ PO TBCR
40.0000 meq | EXTENDED_RELEASE_TABLET | ORAL | Status: AC
  Administered 2019-07-30 (×2): 40 meq via ORAL
  Filled 2019-07-30 (×2): qty 2

## 2019-07-30 MED ORDER — KCL IN DEXTROSE-NACL 20-5-0.2 MEQ/L-%-% IV SOLN
INTRAVENOUS | Status: DC
  Filled 2019-07-30 (×8): qty 1000

## 2019-07-30 MED ORDER — OXYCODONE HCL 5 MG PO TABS
5.0000 mg | ORAL_TABLET | Freq: Four times a day (QID) | ORAL | Status: DC | PRN
  Filled 2019-07-30: qty 1

## 2019-07-30 MED ORDER — SENNOSIDES-DOCUSATE SODIUM 8.6-50 MG PO TABS: 1.0000 | ORAL_TABLET | Freq: Two times a day (BID) | ORAL | Status: DC | PRN

## 2019-07-30 MED ORDER — POLYETHYLENE GLYCOL 3350 17 G PO PACK: 17.0000 g | PACK | Freq: Two times a day (BID) | ORAL | Status: DC | PRN

## 2019-07-30 NOTE — Progress Notes (Addendum)
  Speech Language Pathology Treatment: Dysphagia  Patient Details Name: Nicholas FARABEE Sr. MRN: [id number] DOB: 1929-04-05 Today's Date: 07/30/2019 Time: 9563-8756 SLP Time Calculation (min) (ACUTE ONLY): 26 min  Assessment / Plan / Recommendation Clinical Impression  Pt seen at bedside, HOB raised so patient sitting upright. Patient with waxing/waning attention, appearing lethargic and confused. ST completed oral care, pat with adequate dentition and oral mucosa appearing normal.  ST provided cup sips thin liquids: pt with reduced bolus awareness, significant anterior labial spillage. Possible delayed swallow initiation, but difficult to assess based on clinical presentation. No overt s/sx with cup sips. Pt did well with utilization of straw: able to grasp labially and siphon liquid. Pt with brief oral hold prior to initiation of the swallow, suspected due to reduced bolus awareness. Pt with immediate coughing in 2/6 sips. Coughing appeared strong.  Pt presented with NTL via straw sips: pt able to grasp and siphon liquid, immediate cough noted x1. Pt presented with pureed bolus: pt with reduced bolus awareness despite max verbal/visual/tactile cueing. Pt orally holding, able to swallow with verbal cue. Liquid rinse utilized. Further Po trials discontinued as patient with reduced attention and appearing to fall asleep. D/w RN, patient appears to have tolerated breakfast meal earlier this date well.  ST provided edu to son and RN re: only providing PO when patient is alert, sitting upright, give small bites/sips, check oral cavity for pocketing. Recommend frequent oral care. ST to follow as per POC.  Continue Dysphagia 1/thin liquids.   HPI HPI: Pt is an 86 y.o. male with history of alcohol abuse previous history of diabetes CAD status post stenting many years ago with history of alcohol abuse who had a compression fracture around 3 to 4 months ago. Two days prior pt's was found to be having some  shortness of breath and was diagnosed with possible pneumonia. He presented to the ED after an unwitnessed fall at the nursing facility. CT of the head was negative. Chest x-ray of 2/10 revealed  CHF/volume overload with edema and cardiomegaly. WBC is currently elevated. Pt is afebrile. Chest x-ray of 2/11: Progressive hazy opacities throughout both lungs likely combination of layering pleural effusions and pulmonary edema      SLP Plan  Continue with current plan of care       Recommendations  Diet recommendations: Dysphagia 1 (puree);Thin liquid Liquids provided via: Straw;Cup;Teaspoon Medication Administration: Crushed with puree Supervision: Full supervision/cueing for compensatory strategies;Trained caregiver to feed patient Compensations: Slow rate;Small sips/bites;Minimize environmental distractions Postural Changes and/or Swallow Maneuvers: Seated upright 90 degrees;Upright 30-60 min after meal                Oral Care Recommendations: Oral care BID;Oral care prior to ice chip/H20 Follow up Recommendations: 24 hour supervision/assistance SLP Visit Diagnosis: Dysphagia, unspecified (R13.10) Plan: Continue with current plan of care       GO                Leanora Murin 07/30/2019, 1:05 PM  Shella Spearing, M.Ed., CCC-SLP Speech Therapy Acute Rehabilitation [phone]: Acute Rehab office [phone] - pager

## 2019-07-30 NOTE — Progress Notes (Addendum)
Patient seen and assessed. Patient found leaning to left side in bed.  No acute distress noted. Physical assessment completed via computerized charting per [facility] policy. No family present at bedside. Side rails up x 4. Bed in lowest position and locked. Tele sitter noted. Bilateral soft mitts in place.   MD Communication 07/30/19 @ 2116: On call provider notified related to dressing to right hip. Patient was seen by ortho for ortho surgery.

## 2019-07-30 NOTE — Progress Notes (Signed)
PROGRESS NOTE  Nicholas MINDER Sr. CZY:[id number] DOB: 1928-10-12   PCP: Patient, No Pcp Per  Patient is from: Nursing facility  DOA: 07/21/2019 LOS: 9  Brief Narrative / Interim history: 86 year old male with history of alcohol abuse, CAD/stent, DM-2, recent compression fracture 3 to 4 months ago brought to ED from facility after unwitnessed fall and right femoral fracture.   Patient also hospitalized for compression fracture at [facility] and started to rehab 3 to 4 months ago. Underwent ORIF on 07/26/2019.  Also concern about HCAP based on initial chest x-ray although this was later thought to be due to pulmonary edema.  He completed empiric antibiotic with cefepime for 5 days.  Hospital course complicated by new A. Fib, new systolic CHF with EF of 25 to 09%, hypernatremia and AKI.  Cardiology consulted and recommended diuretics and beta-blocker, and signed off.  Subjective: No major events overnight or this morning.  Awake but not a reliable historian.  He is only oriented to self.  Readily follows command.  Does not appear to be in distress.  Slightly tachycardic on the monitor.  Objective: Vitals:   07/30/19 0327 07/30/19 0802 07/30/19 1410 07/30/19 1411  BP: (!) 118/54 114/78  97/61  Pulse: (!) 107  98 91  Resp: 20     Temp: 97.8 F (36.6 C)  (!) 97.5 F (36.4 C)   TempSrc: Oral  Axillary   SpO2:  94% 97% 96%  Weight:      Height:        Intake/Output Summary (Last 24 hours) at 07/30/2019 1442 Last data filed at 07/30/2019 1410 Gross per 24 hour  Intake 550 ml  Output 2050 ml  Net -1500 ml   Filed Weights   07/26/19 0338 07/27/19 0406 07/28/19 0411  Weight: 78.9 kg 82.6 kg 78.5 kg    Examination:  GENERAL: Chronically ill-appearing.  No apparent distress. HEENT: MMM.  Vision and hearing grossly intact.  NECK: Supple.  No apparent JVD.  RESP:  No IWOB. Good air movement bilaterally. CVS: HR 100s. Heart sounds normal.  ABD/GI/GU: Bowel sounds present.  Soft.  Diffuse tenderness to palpation. MSK/EXT:  Moves extremities.  LUE swelling likely from IV infiltration.  TED hose over LEs. SKIN: no apparent skin lesion or wound NEURO: Awake and oriented to self only.  Moves upper extremities PSYCH: Calm.  No apparent distress or agitation.  Procedures:  2/15-prosthetic replacement for femoral neck fracture by Dr. Roda Shutters  Assessment & Plan: Fall at nursing home Closed displaced fracture of right femoral neck -Status post prosthetic replacement of femoral neck fracture on 2/15 as above -Orthopedic surgery recs-therapy, WBAT, subcu Lovenox for 2 weeks, f/up with Dr. Roda Shutters in two weeks -Continue PT/OT  Acute respiratory failure with hypoxia-likely due to CHF exacerbation and possibly HCAP. -Empirically treated with cefepime for 5 days although infectious process thought to be less likely -Manage CHF as below.  Acute on chronic systolic CHF: Echo with EF of 25 to 30%, global hypokinesis, severe LAE, moderate RAE, normal RVSF.  1.2 L UOP in the last 24 hours.  Developed AKI.  Has LUE edema/swelling likely from IV infiltration of hypotonic solution. -Continue holding Lasix -Discontinue Aldactone. -Monitor fluid status, renal function and electrolytes. -Cardiology signed off.  New onset atrial fibrillation: HR in the range of 110-130.  CHA2DS2-VASc score 5 but not a good candidate for anticoagulation due to recurrent fall. -Increase metoprolol to 50 mg twice daily  AKI: Cr 0.81 (admit) > 1.02 > 1.69.  BUN 70 suggesting prerenal etiology.  Could be hemodynamically mediated in the setting of CHF, A. fib -Discontinue Aldactone -D5-1/4NS-KCl@125  -Renal ultrasound and urine chemistry -Increase metoprolol as above.  Hypernatremia: Na 154> 154> 153.  No improvement with D5. -IVF as above. -Urine chemistry as above.   Acute metabolic encephalopathy in patient with history of dementia: Oriented to self only.  Reportedly has some baseline confusion.  B12  was normal.  RPR nonreactive.  Cortisol appropriately high.  TSH slightly high.  Free T4 normal.  Ammonia 37. -Continue high-dose thiamine empirically given history of alcohol abuse 2/17>> -Follow thiamine level -Continue home memantine -Low-dose lactulose.  Hyperbilirubinemia: Rosanna Randy syndrome?  H&H stable. -Continue monitoring.  Hypokalemia -Replenish and recheck.  Alcohol use disorder: no EtOH for the last three months  Sepsis secondary to HCAP: POA -Completed antibiotic course as above.  Uncontrolled DM-2 with hyperglycemia Recent Labs    07/29/19 2221 07/30/19 0757 07/30/19 1129  GLUCAP 191* 174* 186*  -Continue SSI-moderate  Essential hypertension: BP within fair range. -Lopressor as above.  BPH with acute urinary retention-resolved.  Leukocytosis/bandemia: Likely demargination.  Improved. -Continue monitoring  Thrombocytopenia: Relatively stable -Continue monitoring       Nutrition Problem: Increased nutrient needs Etiology: hip fracture  Signs/Symptoms: estimated needs  Interventions: Ensure Enlive (each supplement provides 350kcal and 20 grams of protein), Juven, Prostat, Magic cup   DVT prophylaxis: Subcu Lovenox Code Status: DNR/DNI Family Communication: Updated patient's son over the phone  Discharge barrier: Encephalopathy, hypernatremia and AKI Patient is from: Nursing home Final disposition: SNF when medically stable, hopefully in the next 24 to 48 hours.  Consultants: Orthopedic surgery and cardiology   Microbiology summarized: COVID-19 negative  blood cultures negative  urine cultures negative  Sch Meds:  Scheduled Meds: . acetaminophen  1,000 mg Oral Q8H  . enoxaparin (LOVENOX) injection  40 mg Subcutaneous Q24H  . feeding supplement (ENSURE ENLIVE)  237 mL Oral TID WC  . feeding supplement (PRO-STAT SUGAR FREE 64)  30 mL Oral TID  . insulin aspart  0-15 Units Subcutaneous TID WC  . insulin aspart  0-5 Units Subcutaneous QHS  .  lactulose  20 g Oral TID  . lidocaine  1 patch Transdermal QHS  . metoprolol tartrate  50 mg Oral BID  . nutrition supplement (JUVEN)  1 packet Oral BID BM  . potassium chloride  40 mEq Oral Q4H   Continuous Infusions: . dextrose 5 % and 0.2 % NaCl with KCl 20 mEq 125 mL/hr at 07/30/19 1347  . thiamine injection 250 mg (07/29/19 1745)   PRN Meds:.acetaminophen, alum & mag hydroxide-simeth, ipratropium-albuterol, magnesium citrate, menthol-cetylpyridinium **OR** phenol, ondansetron **OR** ondansetron (ZOFRAN) IV, oxyCODONE, polyethylene glycol, senna-docusate, sorbitol  Antimicrobials: Anti-infectives (From admission, onward)   Start     Dose/Rate Route Frequency Ordered Stop   07/26/19 1845  ceFAZolin (ANCEF) IVPB 2g/100 mL premix  Status:  Discontinued     2 g 200 mL/hr over 30 Minutes Intravenous Every 6 hours 07/26/19 1844 07/26/19 1855   07/26/19 1544  vancomycin (VANCOCIN) powder  Status:  Discontinued       As needed 07/26/19 1544 07/26/19 1648   07/26/19 1351  ceFAZolin (ANCEF) 2-4 GM/100ML-% IVPB    Note to Pharmacy: Gregery Na   : cabinet override      07/26/19 1351 07/27/19 0159   07/26/19 0600  ceFAZolin (ANCEF) IVPB 2g/100 mL premix    Note to Pharmacy: Anesthesia to give preop   2 g 200 mL/hr over 30  Minutes Intravenous  Once 07/25/19 2013 07/26/19 0650   07/22/19 1445  vancomycin (VANCOREADY) IVPB 1500 mg/300 mL  Status:  Discontinued     1,500 mg 150 mL/hr over 120 Minutes Intravenous Every 24 hours 07/22/19 1433 07/25/19 1239   07/22/19 1430  ceFEPIme (MAXIPIME) 2 g in sodium chloride 0.9 % 100 mL IVPB  Status:  Discontinued     2 g 200 mL/hr over 30 Minutes Intravenous Every 12 hours 07/22/19 1427 07/27/19 0839   07/22/19 0600  ceFAZolin (ANCEF) IVPB 2g/100 mL premix  Status:  Discontinued     2 g 200 mL/hr over 30 Minutes Intravenous On call to O.R. 07/21/19 1620 07/22/19 1359       I have personally reviewed the following labs and images: CBC: Recent  Labs  Lab 07/26/19 0334 07/27/19 0447 07/28/19 0346 07/29/19 0444 07/30/19 0636  WBC 11.5* 14.5* 17.0* 14.0* 13.8*  HGB 15.7 15.1 15.4 15.4 16.2  HCT 48.2 46.3 47.6 47.4 50.0  MCV 99.6 100.4* 101.7* 99.2 99.4  PLT 146* 114* 119* 107* 101*   BMP &GFR Recent Labs  Lab 07/25/19 0353 07/25/19 0353 07/26/19 0334 07/27/19 0447 07/28/19 0346 07/29/19 0444 07/30/19 1055  NA 147*   < > 152* 151* 154* 154* 153*  K 3.2*   < > 3.7 3.5 3.4* 3.4* 3.1*  CL 103   < > 106 106 106 111 111  CO2 33*   < > 32 32 34* 30 32  GLUCOSE 145*   < > 142* 177* 143* 179* 240*  BUN 36*   < > 42* 37* 43* 52* 70*  CREATININE 0.94   < > 1.02 0.80 0.89 1.02 1.69*  CALCIUM 9.0   < > 9.0 8.8* 9.2 9.1 8.8*  MG 2.0  --  2.2  --   --  2.2 2.3  PHOS 3.2  --  3.3  --   --  2.1* 2.6   < > = values in this interval not displayed.   Estimated Creatinine Clearance: 33.2 mL/min (A) (by C-G formula based on SCr of 1.69 mg/dL (H)). Liver & Pancreas: Recent Labs  Lab 07/25/19 0353 07/26/19 0334 07/29/19 0444 07/30/19 1055  AST 74* 33 25  --   ALT 127* 82* 36  --   ALKPHOS 91 82 73  --   BILITOT 2.0* 2.1* 2.1*  --   PROT 5.7* 5.5* 5.9*  --   ALBUMIN 3.0* 2.9* 2.9*  3.0* 2.6*   No results for input(s): LIPASE, AMYLASE in the last 168 hours. Recent Labs  Lab 07/28/19 1626  AMMONIA 37*   Diabetic: No results for input(s): HGBA1C in the last 72 hours. Recent Labs  Lab 07/29/19 1634 07/29/19 1955 07/29/19 2221 07/30/19 0757 07/30/19 1129  GLUCAP 99 148* 191* 174* 186*   Cardiac Enzymes: No results for input(s): CKTOTAL, CKMB, CKMBINDEX, TROPONINI in the last 168 hours. No results for input(s): PROBNP in the last 8760 hours. Coagulation Profile: No results for input(s): INR, PROTIME in the last 168 hours. Thyroid Function Tests: Recent Labs    07/28/19 1626  FREET4 0.99   Lipid Profile: No results for input(s): CHOL, HDL, LDLCALC, TRIG, CHOLHDL, LDLDIRECT in the last 72 hours. Anemia  Panel: Recent Labs    07/28/19 1626  VITAMINB12 425   Urine analysis:    Component Value Date/Time   COLORURINE AMBER (A) 07/22/2019 1535   APPEARANCEUR HAZY (A) 07/22/2019 1535   LABSPEC 1.027 07/22/2019 1535   PHURINE 5.0 07/22/2019 1535  GLUCOSEU NEGATIVE 07/22/2019 1535   HGBUR LARGE (A) 07/22/2019 1535   BILIRUBINUR NEGATIVE 07/22/2019 1535   KETONESUR 5 (A) 07/22/2019 1535   PROTEINUR 100 (A) 07/22/2019 1535   NITRITE NEGATIVE 07/22/2019 1535   LEUKOCYTESUR TRACE (A) 07/22/2019 1535   Sepsis Labs: Invalid input(s): PROCALCITONIN, LACTICIDVEN  Microbiology: Recent Results (from the past 240 hour(s))  Respiratory Panel by RT PCR (Flu A&B, Covid) - Nasopharyngeal Swab     Status: None   Collection Time: 07/21/19  4:00 AM   Specimen: Nasopharyngeal Swab  Result Value Ref Range Status   SARS Coronavirus 2 by RT PCR NEGATIVE NEGATIVE Final    Comment: (NOTE) SARS-CoV-2 target nucleic acids are NOT DETECTED. The SARS-CoV-2 RNA is generally detectable in upper respiratoy specimens during the acute phase of infection. The lowest concentration of SARS-CoV-2 viral copies this assay can detect is 131 copies/mL. A negative result does not preclude SARS-Cov-2 infection and should not be used as the sole basis for treatment or other patient management decisions. A negative result may occur with  improper specimen collection/handling, submission of specimen other than nasopharyngeal swab, presence of viral mutation(s) within the areas targeted by this assay, and inadequate number of viral copies (<131 copies/mL). A negative result must be combined with clinical observations, patient history, and epidemiological information. The expected result is Negative. Fact Sheet for Patients:  https://www.moore.com/https://www.fda.gov/media/142436/download Fact Sheet for Healthcare Providers:  https://www.young.biz/https://www.fda.gov/media/142435/download This test is not yet ap proved or cleared by the [place] FDA and  has  been authorized for detection and/or diagnosis of SARS-CoV-2 by FDA under an Emergency Use Authorization (EUA). This EUA will remain  in effect (meaning this test can be used) for the duration of the COVID-19 declaration under Section 564(b)(1) of the Act, 21 U.S.C. section 360bbb-3(b)(1), unless the authorization is terminated or revoked sooner.    Influenza A by PCR NEGATIVE NEGATIVE Final   Influenza B by PCR NEGATIVE NEGATIVE Final    Comment: (NOTE) The Xpert Xpress SARS-CoV-2/FLU/RSV assay is intended as an aid in  the diagnosis of influenza from Nasopharyngeal swab specimens and  should not be used as a sole basis for treatment. Nasal washings and  aspirates are unacceptable for Xpert Xpress SARS-CoV-2/FLU/RSV  testing. Fact Sheet for Patients: https://www.moore.com/https://www.fda.gov/media/142436/download Fact Sheet for Healthcare Providers: https://www.young.biz/https://www.fda.gov/media/142435/download This test is not yet approved or cleared by the [place] FDA and  has been authorized for detection and/or diagnosis of SARS-CoV-2 by  FDA under an Emergency Use Authorization (EUA). This EUA will remain  in effect (meaning this test can be used) for the duration of the  Covid-19 declaration under Section 564(b)(1) of the Act, 21  U.S.C. section 360bbb-3(b)(1), unless the authorization is  terminated or revoked. Performed at [facility], [address] [place], [place] [address]   Surgical PCR screen     Status: Abnormal   Collection Time: 07/22/19  5:24 AM   Specimen: Nasal Mucosa; Nasal Swab  Result Value Ref Range Status   MRSA, PCR POSITIVE (A) NEGATIVE Final    Comment: RESULT CALLED TO, READ BACK BY AND VERIFIED WITH: RN Cheri Guppy. TEPE [phone] FCP    Staphylococcus aureus POSITIVE (A) NEGATIVE Final    Comment: (NOTE) The Xpert SA Assay (FDA approved for NASAL specimens in patients 84 years of age and older), is one component of a comprehensive surveillance program. It is not intended to  diagnose infection nor to guide or monitor treatment. Performed at [facility], [address] [place], [place] [address]   Culture, Urine     Status: None   Collection Time: 07/22/19  2:23 PM   Specimen: Urine, Catheterized  Result Value Ref Range Status   Specimen Description URINE, CATHETERIZED  Final   Special Requests NONE  Final   Culture   Final    NO GROWTH Performed at [facility], [address] [place], [place] [address]    Report Status 07/23/2019 FINAL  Final  Culture, blood (routine x 2)     Status: None   Collection Time: 07/22/19  3:29 PM   Specimen: BLOOD LEFT HAND  Result Value Ref Range Status   Specimen Description BLOOD LEFT HAND  Final   Special Requests   Final    BOTTLES DRAWN AEROBIC ONLY Blood Culture results may not be optimal due to an inadequate volume of blood received in culture bottles   Culture   Final    NO GROWTH 5 DAYS Performed at [facility], [address], [place], [place] [address]    Report Status 07/27/2019 FINAL  Final  Culture, blood (routine x 2)     Status: None   Collection Time: 07/22/19  3:37 PM   Specimen: BLOOD RIGHT HAND  Result Value Ref Range Status   Specimen Description BLOOD RIGHT HAND  Final   Special Requests   Final    BOTTLES DRAWN AEROBIC ONLY Blood Culture results may not be optimal due to an inadequate volume of blood received in culture bottles   Culture   Final    NO GROWTH 5 DAYS Performed at [facility], [address], [place], [place] [address]    Report Status 07/27/2019 FINAL  Final  Surgical pcr screen     Status: None   Collection Time: 07/26/19 11:46 AM   Specimen: Nasal Mucosa; Nasal Swab  Result Value Ref Range Status   MRSA, PCR NEGATIVE NEGATIVE Final   Staphylococcus aureus NEGATIVE NEGATIVE Final    Comment: (NOTE) The Xpert SA Assay (FDA approved for NASAL specimens in patients 37 years of age and older), is one component of a comprehensive surveillance  program. It is not intended to diagnose infection nor to guide or monitor treatment. Performed at [facility], [address], [place], [place] [address]   SARS CORONAVIRUS 2 (TAT 6-24 HRS) Nasopharyngeal Nasopharyngeal Swab     Status: None   Collection Time: 07/27/19  3:30 PM   Specimen: Nasopharyngeal Swab  Result Value Ref Range Status   SARS Coronavirus 2 NEGATIVE NEGATIVE Final    Comment: (NOTE) SARS-CoV-2 target nucleic acids are NOT DETECTED. The SARS-CoV-2 RNA is generally detectable in upper and lower respiratory specimens during the acute phase of infection. Negative results do not preclude SARS-CoV-2 infection, do not rule out co-infections with other pathogens, and should not be used as the sole basis for treatment or other patient management decisions. Negative results must be combined with clinical observations, patient history, and epidemiological information. The expected result is Negative. Fact Sheet for Patients: HairSlick.no Fact Sheet for Healthcare Providers: quierodirigir.com This test is not yet approved or cleared by the [place] FDA and  has been authorized for detection and/or diagnosis of SARS-CoV-2 by FDA under an Emergency Use Authorization (EUA). This EUA will remain  in effect (meaning this test can be used) for the duration of the COVID-19 declaration under Section 56 4(b)(1) of the Act, 21 U.S.C. section 360bbb-3(b)(1), unless the authorization  is terminated or revoked sooner. Performed at [facility], [address] [place], [place] [address]     Radiology Studies: DG Abd Portable 1V  Result Date: 07/30/2019 CLINICAL DATA:  Abdominal pain EXAM: PORTABLE ABDOMEN - 1 VIEW COMPARISON:  None. FINDINGS: The bowel gas pattern is normal. Cholecystectomy clips. No radio-opaque calculi or other significant radiographic abnormality are seen. IMPRESSION: Negative. Electronically  Signed   By: Duanne Guess D.O.   On: 07/30/2019 14:17   Danial Sisley T. Bryceton Hantz [facility]  If 7PM-7AM, please contact night-coverage [email or web address] Password [facility] 07/30/2019, 2:42 PM

## 2019-07-30 NOTE — TOC Progression Note (Signed)
Transition of Care (TOC) - Progression Note    Patient Details  Name: Nicholas WEATHERFORD Sr. MRN: [id number] Date of Birth: 13-Sep-1928  Transition of Care [facility]) CM/SW Contact  Nonda Lou, [place] Phone Number: 07/30/2019, 3:38 PM  Clinical Narrative:    Patient confirmed to return to [facility]. CSW spoke with admissions contact Whitney. She verified [name] [phone] is point of contact with [facility] if patient to discharge over the weekend.   Expected Discharge Plan: Assisted Living Barriers to Discharge: Continued Medical Work up  Expected Discharge Plan and Services Expected Discharge Plan: Assisted Living       Living arrangements for the past 2 months: Assisted Living Facility([facility])                                       Social Determinants of Health (SDOH) Interventions    Readmission Risk Interventions No flowsheet data found.

## 2019-07-31 DIAGNOSIS — R10823 Right lower quadrant rebound abdominal tenderness: Secondary | ICD-10-CM

## 2019-07-31 LAB — CBC
HCT: 42.1 % (ref 39.0–52.0)
Hemoglobin: 13.9 g/dL (ref 13.0–17.0)
MCH: 32.7 pg (ref 26.0–34.0)
MCHC: 33 g/dL (ref 30.0–36.0)
MCV: 99.1 fL (ref 80.0–100.0)
Platelets: 92 10*3/uL — ABNORMAL LOW (ref 150–400)
RBC: 4.25 MIL/uL (ref 4.22–5.81)
RDW: 14.8 % (ref 11.5–15.5)
WBC: 13.3 10*3/uL — ABNORMAL HIGH (ref 4.0–10.5)
nRBC: 0 % (ref 0.0–0.2)

## 2019-07-31 LAB — RENAL FUNCTION PANEL
Albumin: 2.6 g/dL — ABNORMAL LOW (ref 3.5–5.0)
Anion gap: 12 (ref 5–15)
BUN: 74 mg/dL — ABNORMAL HIGH (ref 8–23)
CO2: 28 mmol/L (ref 22–32)
Calcium: 8.8 mg/dL — ABNORMAL LOW (ref 8.9–10.3)
Chloride: 114 mmol/L — ABNORMAL HIGH (ref 98–111)
Creatinine, Ser: 1.81 mg/dL — ABNORMAL HIGH (ref 0.61–1.24)
GFR calc Af Amer: 38 mL/min — ABNORMAL LOW (ref >60)
GFR calc non Af Amer: 33 mL/min — ABNORMAL LOW (ref >60)
Glucose, Bld: 186 mg/dL — ABNORMAL HIGH (ref 70–99)
Phosphorus: 2.6 mg/dL (ref 2.5–4.6)
Potassium: 4.2 mmol/L (ref 3.5–5.1)
Sodium: 154 mmol/L — ABNORMAL HIGH (ref 135–145)

## 2019-07-31 LAB — MAGNESIUM: Magnesium: 2.3 mg/dL (ref 1.7–2.4)

## 2019-07-31 LAB — GLUCOSE, CAPILLARY
Glucose-Capillary: 175 mg/dL — ABNORMAL HIGH (ref 70–99)
Glucose-Capillary: 212 mg/dL — ABNORMAL HIGH (ref 70–99)
Glucose-Capillary: 183 mg/dL — ABNORMAL HIGH (ref 70–99)
Glucose-Capillary: 126 mg/dL — ABNORMAL HIGH (ref 70–99)

## 2019-07-31 NOTE — Progress Notes (Signed)
Nicholas Trujillo Sr. ZOX:[id number] DOB: 03/26/1929   PCP: Patient, No Pcp Per  Patient is from: Nursing facility  DOA: 07/21/2019 LOS: 10  Brief Narrative / Interim history: 86 year old male with history of alcohol abuse, CAD/stent, DM-2, recent compression fracture 3 to 4 months ago brought to ED from facility after unwitnessed fall and right femoral fracture.   Patient also hospitalized for compression fracture at [facility] and started to rehab 3 to 4 months ago. Underwent ORIF on 07/26/2019.  Also concern about HCAP based on initial chest x-ray although this was later thought to be due to pulmonary edema.  He completed empiric antibiotic with cefepime for 5 days.  Hospital course complicated by new A. Fib, new systolic CHF with EF of 25 to 81%, hypernatremia and AKI.  Cardiology consulted and recommended diuretics and beta-blocker, and signed off.  Subjective: No major events overnight or this morning.  Remains confused.  Responds no to pain.  Awake and oriented to self.  Does not appear to be in distress.  Objective: Vitals:   07/30/19 2019 07/31/19 0402 07/31/19 0528 07/31/19 0813  BP: 108/79 109/77 113/74   Pulse: 96 80 98   Resp: (!) 21 19 (!) 22   Temp:  97.6 F (36.4 C)  97.7 F (36.5 C)  TempSrc:  Axillary  Axillary  SpO2:  98%  97%  Weight:      Height:        Intake/Output Summary (Last 24 hours) at 07/31/2019 1431 Last data filed at 07/31/2019 1240 Gross per 24 hour  Intake 2188.45 ml  Output 700 ml  Net 1488.45 ml   Filed Weights   07/26/19 0338 07/27/19 0406 07/28/19 0411  Weight: 78.9 kg 82.6 kg 78.5 kg    Examination:  GENERAL: Chronically ill-appearing.  No apparent distress. HEENT: MMM.  Vision and hearing grossly intact.  NECK: Supple.  No apparent JVD.  RESP:  No IWOB.  Fair aeration bilaterally. CVS:  RRR. Heart sounds normal.  ABD/GI/GU: Bowel sounds present. Firm and tender mass over RLQ. MSK/EXT:  Moves extremities.   LUE swelling improved.  TED hose over LEs. SKIN: no apparent skin lesion or wound NEURO: Awake and oriented to self.  No apparent focal neuro deficit. PSYCH: Calm. Normal affect.  Procedures:  2/15-prosthetic replacement for femoral neck fracture by Dr. Roda Shutters  Assessment & Plan: Fall at nursing home Closed displaced fracture of right femoral neck -Status post prosthetic replacement of femoral neck fracture on 2/15 as above -Orthopedic surgery recs-therapy, WBAT, subcu Lovenox for 2 weeks, f/up with Dr. Roda Shutters in two weeks -Continue PT/OT  Acute respiratory failure with hypoxia-likely due to CHF exacerbation and possibly HCAP. -Empirically treated with cefepime for 5 days although infectious process thought to be less likely -Manage CHF as below.  Acute on chronic systolic CHF: Echo with EF of 25 to 30%, global hypokinesis, severe LAE, moderate RAE, normal RVSF.  Diuresed with IV Lasix which was discontinued due to hyponatremia and AKI.  He had 1.5 L UOP in the last 24 hours without diuretics.  Creatinine continues to rise but slowed.  -Continue IV fluid in the setting of AKI -Monitor fluid status, renal function and electrolytes. -Cardiology signed off.  New onset atrial fibrillation: HR in 80s to 90s.  CHA2DS2-VASc score 5 but not a good candidate for anticoagulation due to recurrent fall. -Continue metoprolol to 50 mg twice daily  AKI: Cr 0.81 (admit) > 1.02 > 1.69> 1.81.  BUN 70> 74.  Suggesting prerenal etiology.  Could be due to diuretics and A. Fib.  Renal ultrasound with mild bilateral hydro and trace perinephric stranding. -Continue D5-1/4NS-KCl@125  -CT abdomen and pelvis with oral contrast  Refractory hypernatremia: Na 154> >>154>.  Not responding to hypotonic fluid or Aldactone. -Continue IVF as above. -Urine chemistry  RLQ mass and tenderness: none noted on renal ultrasound and KUB. -CT abdomen and pelvis with oral contrast  Acute metabolic encephalopathy in patient with  history of dementia: Oriented to self only.  Reportedly has some baseline confusion.  B12 was normal.  RPR nonreactive.  Cortisol appropriately high.  TSH slightly high.  Free T4 normal.  Ammonia 37. -Continue high-dose thiamine empirically given history of alcohol abuse 2/17>> -Follow thiamine level -Continue home memantine -Continue low-dose lactulose.  Hyperbilirubinemia: Sullivan Lone syndrome?  H&H stable. -Continue monitoring.  Hypokalemia: Resolved. -Continue monitoring  Alcohol use disorder: no EtOH for the last three months  Sepsis secondary to HCAP: POA -Completed antibiotic course as above.  Uncontrolled DM-2 with hyperglycemia Recent Labs    07/30/19 2122 07/31/19 0815 07/31/19 1155  GLUCAP 227* 175* 212*  -Continue SSI-moderate  Essential hypertension: BP within fair range. -Lopressor as above.  BPH with acute urinary retention-resolved.  Leukocytosis/bandemia: Likely demargination.  Improved. -Continue monitoring  Thrombocytopenia: Relatively stable -Continue monitoring       Nutrition Problem: Increased nutrient needs Etiology: hip fracture  Signs/Symptoms: estimated needs  Interventions: Ensure Enlive (each supplement provides 350kcal and 20 grams of protein), Juven, Prostat, Magic cup   DVT prophylaxis: Subcu Lovenox Code Status: DNR/DNI Family Communication: Updated patient's son over the phone  Discharge barrier: Hypernatremia, AKI and abdominal tenderness.  Remains on IV fluid.  Work-up for hypernatremia/AKI and abdominal tenderness. Patient is from: Nursing home Final disposition: SNF when medically stable, hopefully in the next 24 to 48 hours.  Consultants: Orthopedic surgery and cardiology   Microbiology summarized: COVID-19 negative  blood cultures negative  urine cultures negative  Sch Meds:  Scheduled Meds: . acetaminophen  1,000 mg Oral Q8H  . enoxaparin (LOVENOX) injection  40 mg Subcutaneous Q24H  . feeding supplement (ENSURE  ENLIVE)  237 mL Oral TID WC  . feeding supplement (PRO-STAT SUGAR FREE 64)  30 mL Oral TID  . insulin aspart  0-15 Units Subcutaneous TID WC  . insulin aspart  0-5 Units Subcutaneous QHS  . lactulose  20 g Oral TID  . lidocaine  1 patch Transdermal QHS  . metoprolol tartrate  50 mg Oral BID  . nutrition supplement (JUVEN)  1 packet Oral BID BM   Continuous Infusions: . dextrose 5 % and 0.2 % NaCl with KCl 20 mEq 125 mL/hr at 07/31/19 0719  . thiamine injection 250 mg (07/30/19 1854)   PRN Meds:.acetaminophen, alum & mag hydroxide-simeth, ipratropium-albuterol, menthol-cetylpyridinium **OR** phenol, ondansetron **OR** ondansetron (ZOFRAN) IV, oxyCODONE, sorbitol  Antimicrobials: Anti-infectives (From admission, onward)   Start     Dose/Rate Route Frequency Ordered Stop   07/26/19 1845  ceFAZolin (ANCEF) IVPB 2g/100 mL premix  Status:  Discontinued     2 g 200 mL/hr over 30 Minutes Intravenous Every 6 hours 07/26/19 1844 07/26/19 1855   07/26/19 1544  vancomycin (VANCOCIN) powder  Status:  Discontinued       As needed 07/26/19 1544 07/26/19 1648   07/26/19 1351  ceFAZolin (ANCEF) 2-4 GM/100ML-% IVPB    Note to Pharmacy: Tawanna Sat   : cabinet override      07/26/19 1351 07/27/19 0159   07/26/19 0600  ceFAZolin (  ANCEF) IVPB 2g/100 mL premix    Note to Pharmacy: Anesthesia to give preop   2 g 200 mL/hr over 30 Minutes Intravenous  Once 07/25/19 2013 07/26/19 0650   07/22/19 1445  vancomycin (VANCOREADY) IVPB 1500 mg/300 mL  Status:  Discontinued     1,500 mg 150 mL/hr over 120 Minutes Intravenous Every 24 hours 07/22/19 1433 07/25/19 1239   07/22/19 1430  ceFEPIme (MAXIPIME) 2 g in sodium chloride 0.9 % 100 mL IVPB  Status:  Discontinued     2 g 200 mL/hr over 30 Minutes Intravenous Every 12 hours 07/22/19 1427 07/27/19 0839   07/22/19 0600  ceFAZolin (ANCEF) IVPB 2g/100 mL premix  Status:  Discontinued     2 g 200 mL/hr over 30 Minutes Intravenous On call to O.R. 07/21/19 1620  07/22/19 1359       I have personally reviewed the following labs and images: CBC: Recent Labs  Lab 07/27/19 0447 07/28/19 0346 07/29/19 0444 07/30/19 0636 07/31/19 0355  WBC 14.5* 17.0* 14.0* 13.8* 13.3*  HGB 15.1 15.4 15.4 16.2 13.9  HCT 46.3 47.6 47.4 50.0 42.1  MCV 100.4* 101.7* 99.2 99.4 99.1  PLT 114* 119* 107* 101* 92*   BMP &GFR Recent Labs  Lab 07/25/19 0353 07/25/19 0353 07/26/19 0334 07/26/19 0334 07/27/19 0447 07/28/19 0346 07/29/19 0444 07/30/19 1055 07/31/19 0355  NA 147*   < > 152*   < > 151* 154* 154* 153* 154*  K 3.2*   < > 3.7   < > 3.5 3.4* 3.4* 3.1* 4.2  CL 103   < > 106   < > 106 106 111 111 114*  CO2 33*   < > 32   < > 32 34* 30 32 28  GLUCOSE 145*   < > 142*   < > 177* 143* 179* 240* 186*  BUN 36*   < > 42*   < > 37* 43* 52* 70* 74*  CREATININE 0.94   < > 1.02   < > 0.80 0.89 1.02 1.69* 1.81*  CALCIUM 9.0   < > 9.0   < > 8.8* 9.2 9.1 8.8* 8.8*  MG 2.0  --  2.2  --   --   --  2.2 2.3 2.3  PHOS 3.2  --  3.3  --   --   --  2.1* 2.6 2.6   < > = values in this interval not displayed.   Estimated Creatinine Clearance: 31 mL/min (A) (by C-G formula based on SCr of 1.81 mg/dL (H)). Liver & Pancreas: Recent Labs  Lab 07/25/19 0353 07/26/19 0334 07/29/19 0444 07/30/19 1055 07/31/19 0355  AST 74* 33 25  --   --   ALT 127* 82* 36  --   --   ALKPHOS 91 82 73  --   --   BILITOT 2.0* 2.1* 2.1*  --   --   PROT 5.7* 5.5* 5.9*  --   --   ALBUMIN 3.0* 2.9* 2.9*  3.0* 2.6* 2.6*   No results for input(s): LIPASE, AMYLASE in the last 168 hours. Recent Labs  Lab 07/28/19 1626  AMMONIA 37*   Diabetic: No results for input(s): HGBA1C in the last 72 hours. Recent Labs  Lab 07/30/19 1129 07/30/19 1624 07/30/19 2122 07/31/19 0815 07/31/19 1155  GLUCAP 186* 133* 227* 175* 212*   Cardiac Enzymes: No results for input(s): CKTOTAL, CKMB, CKMBINDEX, TROPONINI in the last 168 hours. No results for input(s): PROBNP in the last 8760  hours.  Coagulation Profile: No results for input(s): INR, PROTIME in the last 168 hours. Thyroid Function Tests: Recent Labs    07/28/19 1626  FREET4 0.99   Lipid Profile: No results for input(s): CHOL, HDL, LDLCALC, TRIG, CHOLHDL, LDLDIRECT in the last 72 hours. Anemia Panel: Recent Labs    07/28/19 1626  VITAMINB12 425   Urine analysis:    Component Value Date/Time   COLORURINE AMBER (A) 07/22/2019 1535   APPEARANCEUR HAZY (A) 07/22/2019 1535   LABSPEC 1.027 07/22/2019 1535   PHURINE 5.0 07/22/2019 1535   GLUCOSEU NEGATIVE 07/22/2019 1535   HGBUR LARGE (A) 07/22/2019 1535   BILIRUBINUR NEGATIVE 07/22/2019 1535   KETONESUR 5 (A) 07/22/2019 1535   PROTEINUR 100 (A) 07/22/2019 1535   NITRITE NEGATIVE 07/22/2019 1535   LEUKOCYTESUR TRACE (A) 07/22/2019 1535   Sepsis Labs: Invalid input(s): PROCALCITONIN, LACTICIDVEN  Microbiology: Recent Results (from the past 240 hour(s))  Surgical PCR screen     Status: Abnormal   Collection Time: 07/22/19  5:24 AM   Specimen: Nasal Mucosa; Nasal Swab  Result Value Ref Range Status   MRSA, PCR POSITIVE (A) NEGATIVE Final    Comment: RESULT CALLED TO, READ BACK BY AND VERIFIED WITH: RN Cheri Guppy [phone] FCP    Staphylococcus aureus POSITIVE (A) NEGATIVE Final    Comment: (NOTE) The Xpert SA Assay (FDA approved for NASAL specimens in patients 90 years of age and older), is one component of a comprehensive surveillance program. It is not intended to diagnose infection nor to guide or monitor treatment. Performed at [facility], [address], [place], [place] [address]   Culture, Urine     Status: None   Collection Time: 07/22/19  2:23 PM   Specimen: Urine, Catheterized  Result Value Ref Range Status   Specimen Description URINE, CATHETERIZED  Final   Special Requests NONE  Final   Culture   Final    NO GROWTH Performed at [facility], [address] [place], [place] [address]    Report Status 07/23/2019  FINAL  Final  Culture, blood (routine x 2)     Status: None   Collection Time: 07/22/19  3:29 PM   Specimen: BLOOD LEFT HAND  Result Value Ref Range Status   Specimen Description BLOOD LEFT HAND  Final   Special Requests   Final    BOTTLES DRAWN AEROBIC ONLY Blood Culture results may not be optimal due to an inadequate volume of blood received in culture bottles   Culture   Final    NO GROWTH 5 DAYS Performed at [facility], [address], [place], [place] [address]    Report Status 07/27/2019 FINAL  Final  Culture, blood (routine x 2)     Status: None   Collection Time: 07/22/19  3:37 PM   Specimen: BLOOD RIGHT HAND  Result Value Ref Range Status   Specimen Description BLOOD RIGHT HAND  Final   Special Requests   Final    BOTTLES DRAWN AEROBIC ONLY Blood Culture results may not be optimal due to an inadequate volume of blood received in culture bottles   Culture   Final    NO GROWTH 5 DAYS Performed at [facility], [address], [place], [place] [address]    Report Status 07/27/2019 FINAL  Final  Surgical pcr screen     Status: None   Collection Time: 07/26/19 11:46 AM   Specimen: Nasal Mucosa; Nasal Swab  Result Value  Ref Range Status   MRSA, PCR NEGATIVE NEGATIVE Final   Staphylococcus aureus NEGATIVE NEGATIVE Final    Comment: (NOTE) The Xpert SA Assay (FDA approved for NASAL specimens in patients 70 years of age and older), is one component of a comprehensive surveillance program. It is not intended to diagnose infection nor to guide or monitor treatment. Performed at [facility], [place] [address], [place], [place] [address]   SARS CORONAVIRUS 2 (TAT 6-24 HRS) Nasopharyngeal Nasopharyngeal Swab     Status: None   Collection Time: 07/27/19  3:30 PM   Specimen: Nasopharyngeal Swab  Result Value Ref Range Status   SARS Coronavirus 2 NEGATIVE NEGATIVE Final    Comment: (NOTE) SARS-CoV-2 target nucleic acids are NOT DETECTED. The SARS-CoV-2 RNA is  generally detectable in upper and lower respiratory specimens during the acute phase of infection. Negative results do not preclude SARS-CoV-2 infection, do not rule out co-infections with other pathogens, and should not be used as the sole basis for treatment or other patient management decisions. Negative results must be combined with clinical observations, patient history, and epidemiological information. The expected result is Negative. Fact Sheet for Patients: SugarRoll.be Fact Sheet for Healthcare Providers: https://www.woods-mathews.com/ This test is not yet approved or cleared by the [place] FDA and  has been authorized for detection and/or diagnosis of SARS-CoV-2 by FDA under an Emergency Use Authorization (EUA). This EUA will remain  in effect (meaning this test can be used) for the duration of the COVID-19 declaration under Section 56 4(b)(1) of the Act, 21 U.S.C. section 360bbb-3(b)(1), unless the authorization is terminated or revoked sooner. Performed at [facility], [place] [address], [place], [place] [address]     Radiology Studies: US RENAL  Result Date: 07/30/2019 CLINICAL DATA:  Acute renal injury. EXAM: RENAL / URINARY TRACT ULTRASOUND COMPLETE COMPARISON:  CT report 08/29/2012. FINDINGS: Right Kidney: Renal measurements: 10.2 x 5.5 x 6.2 cm = volume: 180.7 mL . Echogenicity within normal limits. No mass. Mild hydronephrosis. Trace perinephric fluid. Left Kidney: Renal measurements: 11.0 x 5.7 x 5.9 cm = volume: 191.6 mL. Echogenicity within normal limits. No mass. Mild hydronephrosis. Trace perinephric fluid. Bladder: Appears normal for degree of bladder distention. Ureteral jets not visualized. Other: None. IMPRESSION: Mild bilateral hydronephrosis. Trace perinephric fluid noted bilaterally. Electronically Signed   By: Marcello Moores  Register   On: 07/30/2019 15:00   Honora Searson T. [place]  If 7PM-7AM, please  contact night-coverage [email or web address] Password [facility] 07/31/2019, 2:31 PM

## 2019-07-31 NOTE — Progress Notes (Signed)
Patients son called for update on patient. All questions answered and son expressed gratitude for care provided to his dad

## 2019-08-01 ENCOUNTER — Inpatient Hospital Stay (HOSPITAL_COMMUNITY): Payer: Medicare Other

## 2019-08-01 DIAGNOSIS — Z515 Encounter for palliative care: Secondary | ICD-10-CM

## 2019-08-01 LAB — RENAL FUNCTION PANEL
Albumin: 2.7 g/dL — ABNORMAL LOW (ref 3.5–5.0)
Anion gap: 11 (ref 5–15)
BUN: 72 mg/dL — ABNORMAL HIGH (ref 8–23)
CO2: 27 mmol/L (ref 22–32)
Calcium: 8.6 mg/dL — ABNORMAL LOW (ref 8.9–10.3)
Chloride: 112 mmol/L — ABNORMAL HIGH (ref 98–111)
Creatinine, Ser: 1.84 mg/dL — ABNORMAL HIGH (ref 0.61–1.24)
GFR calc Af Amer: 37 mL/min — ABNORMAL LOW (ref >60)
GFR calc non Af Amer: 32 mL/min — ABNORMAL LOW (ref >60)
Glucose, Bld: 197 mg/dL — ABNORMAL HIGH (ref 70–99)
Phosphorus: 2.3 mg/dL — ABNORMAL LOW (ref 2.5–4.6)
Potassium: 5.8 mmol/L — ABNORMAL HIGH (ref 3.5–5.1)
Sodium: 150 mmol/L — ABNORMAL HIGH (ref 135–145)

## 2019-08-01 LAB — GLUCOSE, CAPILLARY
Glucose-Capillary: 184 mg/dL — ABNORMAL HIGH (ref 70–99)
Glucose-Capillary: 166 mg/dL — ABNORMAL HIGH (ref 70–99)

## 2019-08-01 LAB — CBC
HCT: 43.7 % (ref 39.0–52.0)
Hemoglobin: 14.2 g/dL (ref 13.0–17.0)
MCH: 32.3 pg (ref 26.0–34.0)
MCHC: 32.5 g/dL (ref 30.0–36.0)
MCV: 99.5 fL (ref 80.0–100.0)
Platelets: 83 10*3/uL — ABNORMAL LOW (ref 150–400)
RBC: 4.39 MIL/uL (ref 4.22–5.81)
RDW: 14.8 % (ref 11.5–15.5)
WBC: 14.9 10*3/uL — ABNORMAL HIGH (ref 4.0–10.5)
nRBC: 0 % (ref 0.0–0.2)

## 2019-08-01 LAB — MAGNESIUM: Magnesium: 2.4 mg/dL (ref 1.7–2.4)

## 2019-08-01 MED ORDER — GLYCOPYRROLATE 0.2 MG/ML IJ SOLN: 0.2000 mg | INTRAMUSCULAR | Status: DC | PRN

## 2019-08-01 MED ORDER — POLYVINYL ALCOHOL 1.4 % OP SOLN
1.0000 [drp] | Freq: Four times a day (QID) | OPHTHALMIC | Status: DC | PRN
  Filled 2019-08-01: qty 15

## 2019-08-01 MED ORDER — LORAZEPAM 2 MG/ML PO CONC: 1.0000 mg | ORAL | Status: DC | PRN

## 2019-08-01 MED ORDER — DEXTROSE-NACL 5-0.2 % IV SOLN: INTRAVENOUS | Status: DC

## 2019-08-01 MED ORDER — ONDANSETRON 4 MG PO TBDP
4.0000 mg | ORAL_TABLET | Freq: Four times a day (QID) | ORAL | Status: DC | PRN
  Filled 2019-08-01: qty 1

## 2019-08-01 MED ORDER — MORPHINE SULFATE (PF) 2 MG/ML IV SOLN: 1.0000 mg | INTRAVENOUS | Status: DC | PRN

## 2019-08-01 MED ORDER — HALOPERIDOL 0.5 MG PO TABS
0.5000 mg | ORAL_TABLET | ORAL | Status: DC | PRN
  Filled 2019-08-01: qty 1

## 2019-08-01 MED ORDER — ONDANSETRON HCL 4 MG/2ML IJ SOLN: 4.0000 mg | Freq: Four times a day (QID) | INTRAMUSCULAR | Status: DC | PRN

## 2019-08-01 MED ORDER — HALOPERIDOL LACTATE 5 MG/ML IJ SOLN
0.5000 mg | INTRAMUSCULAR | Status: DC | PRN
  Administered 2019-08-01: 0.5 mg via INTRAVENOUS
  Filled 2019-08-01 (×2): qty 1

## 2019-08-01 MED ORDER — ACETAMINOPHEN 650 MG RE SUPP: 650.0000 mg | Freq: Four times a day (QID) | RECTAL | Status: DC | PRN

## 2019-08-01 MED ORDER — GLYCOPYRROLATE 1 MG PO TABS
1.0000 mg | ORAL_TABLET | ORAL | Status: DC | PRN
  Filled 2019-08-01: qty 1

## 2019-08-01 MED ORDER — ACETAMINOPHEN 325 MG PO TABS: 650.0000 mg | ORAL_TABLET | Freq: Four times a day (QID) | ORAL | Status: DC | PRN

## 2019-08-01 MED ORDER — HALOPERIDOL LACTATE 2 MG/ML PO CONC
0.5000 mg | ORAL | Status: DC | PRN
  Filled 2019-08-01: qty 0.3

## 2019-08-01 MED ORDER — LORAZEPAM 2 MG/ML IJ SOLN: 1.0000 mg | INTRAMUSCULAR | Status: DC | PRN

## 2019-08-01 MED ORDER — LORAZEPAM 1 MG PO TABS: 1.0000 mg | ORAL_TABLET | ORAL | Status: DC | PRN

## 2019-08-01 NOTE — Progress Notes (Signed)
PROGRESS NOTE  Nicholas FUSTON Sr. HYW:[id number] DOB: 27-Oct-1928   PCP: Patient, No Pcp Per  Patient is from: Nursing facility  DOA: 07/21/2019 LOS: 11  Brief Narrative / Interim history: 86 year old male with history of alcohol abuse, CAD/stent, DM-2, recent compression fracture 3 to 4 months ago brought to ED from facility after unwitnessed fall and right femoral fracture.   Patient also hospitalized for compression fracture at [facility] and started to rehab 3 to 4 months ago. Underwent ORIF on 07/26/2019.  Also concern about HCAP based on initial chest x-ray although this was later thought to be due to pulmonary edema.  He completed empiric antibiotic with cefepime for 5 days.  Hospital course complicated by new A. Fib, new systolic CHF with EF of 25 to 48%, hypernatremia and AKI.  Cardiology consulted and recommended diuretics and beta-blocker, and signed off.  Subjective: No major events overnight or this morning.  Awake but confused.  Oriented to self.  Does not provide further history.  Objective: Vitals:   07/31/19 1450 07/31/19 2001 08/01/19 0443 08/01/19 0745  BP:  129/79 115/66   Pulse:  96 (!) 111 (!) 56  Resp:  20    Temp: 97.7 F (36.5 C) 98.1 F (36.7 C) 98.3 F (36.8 C)   TempSrc: Axillary Axillary Axillary   SpO2:  96% 97% 96%  Weight:      Height:        Intake/Output Summary (Last 24 hours) at 08/01/2019 1212 Last data filed at 08/01/2019 0447 Gross per 24 hour  Intake 120 ml  Output 1025 ml  Net -905 ml   Filed Weights   07/26/19 0338 07/27/19 0406 07/28/19 0411  Weight: 78.9 kg 82.6 kg 78.5 kg    Examination:  GENERAL: Chronically ill-appearing.  No apparent distress. HEENT: MMM.  Vision and hearing grossly intact.  NECK: Supple.  No apparent JVD.  RESP: On RA.  No IWOB.  Fair aeration bilaterally. CVS: HR in 100s.Marland Kitchen Heart sounds normal.  ABD/GI/GU: Bowel sounds present.  Firm and tender mas in RLQ. MSK/EXT:  Moves extremities.  TED  hose over BLE SKIN: no apparent skin lesion or wound NEURO: Awake but confused.  Does not follows command.  No apparent focal neuro deficit. PSYCH: Calm. Normal affect.   Procedures:  2/15-prosthetic replacement for femoral neck fracture by Dr. Roda Shutters  Assessment & Plan: Fall at nursing home Closed displaced fracture of right femoral neck -Status post prosthetic replacement of femoral neck fracture on 2/15 as above  Acute respiratory failure with hypoxia-likely due to CHF exacerbation and possibly HCAP. -Empirically treated with cefepime for 5 days although infectious process thought to be less likely  Acute on chronic systolic CHF: Echo with EF of 25 to 30%, global hypokinesis, severe LAE, moderate RAE, normal RVSF.  Diuresed with IV Lasix which was discontinued due to hyponatremia and AKI.   New onset atrial fibrillation: HR in 90s to 100s  AKI: Cr 0.81 (admit) > 1.02 > 1.69> 1.81> 1.84.  BUN 70> 72.  Suggesting prerenal etiology.  Could be due to diuretics and A. Fib.  Renal ultrasound with mild bilateral hydro and trace perinephric stranding.  CT abdomen and pelvis with mild bladder distention and rectal wall thickening  Refractory hypernatremia: Na 154> >>150>.  Not responding to hypotonic fluid or Aldactone.  RLQ mass? and tenderness: No ultrasound or CT finding to explain this.  Acute metabolic encephalopathy in patient with history of dementia: Oriented to self only.  Reportedly has some  baseline confusion.  B12 was normal.  RPR nonreactive.  Cortisol appropriately high.  TSH slightly high.  Free T4 normal.  Ammonia 37.  Hyperbilirubinemia: Rosanna Randy syndrome?  H&H stable.  Hyperkalemia: Slightly hemolyzed sample.  Alcohol use disorder: no EtOH for the last three months  Sepsis secondary to HCAP: POA  Uncontrolled DM-2 with hyperglycemia:  Essential hypertension: BP within fair range.  BPH with acute urinary retention-resolved.  Leukocytosis/bandemia: Likely demargination.   Improved.  Thrombocytopenia: Relatively stable  Goal of care discussion: Extensive discussion with patient's son, Nicholas Trujillo.  Patient is appropriately DNR/DNI.  Has significant comorbidities including advanced heart failure, diabetes, HTN and dementia.  Now with AKI, significant dysphagia, right hip fracture and refractory hypernatremia.  Continues to do poorly.  Very poor prognosis.  Even if he recovers from current illness, quality of life and prognosis remains poor.  I have also discussed about treatment options including current scope of care or comfort measures.  Per Nicholas Trujillo, Nicholas Trujillo does not want to have his life prolonged in this manner.  His wife passed away about 8 years ago.  He always said he wanted to be his wife when it is time.  Nicholas Trujillo think this is the time, and wants his father kept comfortable for the time he got left.  I have explained what full comfort measures entails.  Nicholas Trujillo voiced understanding this. Nicholas Trujillo also wants his father to be transferred to [facility] burn for hospice if he happens to be with [place] for the next 24 hours.  He also requested a long visitation by grandchildren who are over 6 years of age.  Comfort measures initiated.  RN notified.  TOC consulted.     End-of-life care/DNR/DNI  Nutrition Problem: Increased nutrient needs Etiology: hip fracture  Signs/Symptoms: estimated needs  Interventions: Ensure Enlive (each supplement provides 350kcal and 20 grams of protein), Juven, Prostat, Magic cup   DVT prophylaxis: Subcu Lovenox Code Status: DNR/DNI Family Communication: Updated patient's son over the phone  Discharge barrier: Hospice. Patient is from: Nursing home Final disposition: Residential hospice  Consultants: Orthopedic surgery and cardiology   Microbiology summarized: COVID-19 negative  blood cultures negative  urine cultures negative  Sch Meds:  Scheduled Meds: . acetaminophen  1,000 mg Oral Q8H  . enoxaparin (LOVENOX) injection  40 mg  Subcutaneous Q24H  . feeding supplement (ENSURE ENLIVE)  237 mL Oral TID WC  . feeding supplement (PRO-STAT SUGAR FREE 64)  30 mL Oral TID  . insulin aspart  0-15 Units Subcutaneous TID WC  . insulin aspart  0-5 Units Subcutaneous QHS  . lactulose  20 g Oral TID  . lidocaine  1 patch Transdermal QHS  . metoprolol tartrate  50 mg Oral BID  . nutrition supplement (JUVEN)  1 packet Oral BID BM   Continuous Infusions: . dextrose 5 % and 0.2 % NaCl 125 mL/hr at 08/01/19 0935  . thiamine injection 250 mg (07/31/19 1655)   PRN Meds:.acetaminophen, alum & mag hydroxide-simeth, ipratropium-albuterol, menthol-cetylpyridinium **OR** phenol, ondansetron **OR** ondansetron (ZOFRAN) IV, oxyCODONE, sorbitol  Antimicrobials: Anti-infectives (From admission, onward)   Start     Dose/Rate Route Frequency Ordered Stop   07/26/19 1845  ceFAZolin (ANCEF) IVPB 2g/100 mL premix  Status:  Discontinued     2 g 200 mL/hr over 30 Minutes Intravenous Every 6 hours 07/26/19 1844 07/26/19 1855   07/26/19 1544  vancomycin (VANCOCIN) powder  Status:  Discontinued       As needed 07/26/19 1544 07/26/19 1648   07/26/19 1351  ceFAZolin (ANCEF) 2-4 GM/100ML-% IVPB    Note to Pharmacy: Tawanna Sat   : cabinet override      07/26/19 1351 07/27/19 0159   07/26/19 0600  ceFAZolin (ANCEF) IVPB 2g/100 mL premix    Note to Pharmacy: Anesthesia to give preop   2 g 200 mL/hr over 30 Minutes Intravenous  Once 07/25/19 2013 07/26/19 0650   07/22/19 1445  vancomycin (VANCOREADY) IVPB 1500 mg/300 mL  Status:  Discontinued     1,500 mg 150 mL/hr over 120 Minutes Intravenous Every 24 hours 07/22/19 1433 07/25/19 1239   07/22/19 1430  ceFEPIme (MAXIPIME) 2 g in sodium chloride 0.9 % 100 mL IVPB  Status:  Discontinued     2 g 200 mL/hr over 30 Minutes Intravenous Every 12 hours 07/22/19 1427 07/27/19 0839   07/22/19 0600  ceFAZolin (ANCEF) IVPB 2g/100 mL premix  Status:  Discontinued     2 g 200 mL/hr over 30 Minutes  Intravenous On call to O.R. 07/21/19 1620 07/22/19 1359       I have personally reviewed the following labs and images: CBC: Recent Labs  Lab 07/28/19 0346 07/29/19 0444 07/30/19 0636 07/31/19 0355 08/01/19 0323  WBC 17.0* 14.0* 13.8* 13.3* 14.9*  HGB 15.4 15.4 16.2 13.9 14.2  HCT 47.6 47.4 50.0 42.1 43.7  MCV 101.7* 99.2 99.4 99.1 99.5  PLT 119* 107* 101* 92* 83*   BMP &GFR Recent Labs  Lab 07/26/19 0334 07/27/19 0447 07/28/19 0346 07/29/19 0444 07/30/19 1055 07/31/19 0355 08/01/19 0323  NA 152*   < > 154* 154* 153* 154* 150*  K 3.7   < > 3.4* 3.4* 3.1* 4.2 5.8*  CL 106   < > 106 111 111 114* 112*  CO2 32   < > 34* 30 32 28 27  GLUCOSE 142*   < > 143* 179* 240* 186* 197*  BUN 42*   < > 43* 52* 70* 74* 72*  CREATININE 1.02   < > 0.89 1.02 1.69* 1.81* 1.84*  CALCIUM 9.0   < > 9.2 9.1 8.8* 8.8* 8.6*  MG 2.2  --   --  2.2 2.3 2.3 2.4  PHOS 3.3  --   --  2.1* 2.6 2.6 2.3*   < > = values in this interval not displayed.   Estimated Creatinine Clearance: 30.5 mL/min (A) (by C-G formula based on SCr of 1.84 mg/dL (H)). Liver & Pancreas: Recent Labs  Lab 07/26/19 0334 07/29/19 0444 07/30/19 1055 07/31/19 0355 08/01/19 0323  AST 33 25  --   --   --   ALT 82* 36  --   --   --   ALKPHOS 82 73  --   --   --   BILITOT 2.1* 2.1*  --   --   --   PROT 5.5* 5.9*  --   --   --   ALBUMIN 2.9* 2.9*  3.0* 2.6* 2.6* 2.7*   No results for input(s): LIPASE, AMYLASE in the last 168 hours. Recent Labs  Lab 07/28/19 1626  AMMONIA 37*   Diabetic: No results for input(s): HGBA1C in the last 72 hours. Recent Labs  Lab 07/31/19 1155 07/31/19 1724 07/31/19 2137 08/01/19 0802 08/01/19 1159  GLUCAP 212* 183* 126* 184* 166*   Cardiac Enzymes: No results for input(s): CKTOTAL, CKMB, CKMBINDEX, TROPONINI in the last 168 hours. No results for input(s): PROBNP in the last 8760 hours. Coagulation Profile: No results for input(s): INR, PROTIME in the last 168 hours. Thyroid  Function Tests: No results for input(s): TSH, T4TOTAL, FREET4, T3FREE, THYROIDAB in the last 72 hours. Lipid Profile: No results for input(s): CHOL, HDL, LDLCALC, TRIG, CHOLHDL, LDLDIRECT in the last 72 hours. Anemia Panel: No results for input(s): VITAMINB12, FOLATE, FERRITIN, TIBC, IRON, RETICCTPCT in the last 72 hours. Urine analysis:    Component Value Date/Time   COLORURINE AMBER (A) 07/22/2019 1535   APPEARANCEUR HAZY (A) 07/22/2019 1535   LABSPEC 1.027 07/22/2019 1535   PHURINE 5.0 07/22/2019 1535   GLUCOSEU NEGATIVE 07/22/2019 1535   HGBUR LARGE (A) 07/22/2019 1535   BILIRUBINUR NEGATIVE 07/22/2019 1535   KETONESUR 5 (A) 07/22/2019 1535   PROTEINUR 100 (A) 07/22/2019 1535   NITRITE NEGATIVE 07/22/2019 1535   LEUKOCYTESUR TRACE (A) 07/22/2019 1535   Sepsis Labs: Invalid input(s): PROCALCITONIN, LACTICIDVEN  Microbiology: Recent Results (from the past 240 hour(s))  Culture, Urine     Status: None   Collection Time: 07/22/19  2:23 PM   Specimen: Urine, Catheterized  Result Value Ref Range Status   Specimen Description URINE, CATHETERIZED  Final   Special Requests NONE  Final   Culture   Final    NO GROWTH Performed at [facility], [address], [place], [place] [address]    Report Status 07/23/2019 FINAL  Final  Culture, blood (routine x 2)     Status: None   Collection Time: 07/22/19  3:29 PM   Specimen: BLOOD LEFT HAND  Result Value Ref Range Status   Specimen Description BLOOD LEFT HAND  Final   Special Requests   Final    BOTTLES DRAWN AEROBIC ONLY Blood Culture results may not be optimal due to an inadequate volume of blood received in culture bottles   Culture   Final    NO GROWTH 5 DAYS Performed at [facility], [address], [place], [place] [address]    Report Status 07/27/2019 FINAL  Final  Culture, blood (routine x 2)     Status: None   Collection Time: 07/22/19  3:37 PM   Specimen: BLOOD RIGHT HAND  Result Value Ref Range Status     Specimen Description BLOOD RIGHT HAND  Final   Special Requests   Final    BOTTLES DRAWN AEROBIC ONLY Blood Culture results may not be optimal due to an inadequate volume of blood received in culture bottles   Culture   Final    NO GROWTH 5 DAYS Performed at [facility], [address], [place], [place] [address]    Report Status 07/27/2019 FINAL  Final  Surgical pcr screen     Status: None   Collection Time: 07/26/19 11:46 AM   Specimen: Nasal Mucosa; Nasal Swab  Result Value Ref Range Status   MRSA, PCR NEGATIVE NEGATIVE Final   Staphylococcus aureus NEGATIVE NEGATIVE Final    Comment: (NOTE) The Xpert SA Assay (FDA approved for NASAL specimens in patients 22 years of age and older), is one component of a comprehensive surveillance program. It is not intended to diagnose infection nor to guide or monitor treatment. Performed at [facility], [address], [place], [place] [address]   SARS CORONAVIRUS 2 (TAT 6-24 HRS) Nasopharyngeal Nasopharyngeal Swab     Status: None   Collection Time: 07/27/19  3:30 PM   Specimen: Nasopharyngeal Swab  Result Value Ref Range Status   SARS Coronavirus 2 NEGATIVE NEGATIVE Final    Comment: (NOTE) SARS-CoV-2 target nucleic acids are NOT DETECTED. The SARS-CoV-2 RNA  is generally detectable in upper and lower respiratory specimens during the acute phase of infection. Negative results do not preclude SARS-CoV-2 infection, do not rule out co-infections with other pathogens, and should not be used as the sole basis for treatment or other patient management decisions. Negative results must be combined with clinical observations, patient history, and epidemiological information. The expected result is Negative. Fact Sheet for Patients: HairSlick.no Fact Sheet for Healthcare Providers: quierodirigir.com This test is not yet approved or cleared by the [place] FDA and  has  been authorized for detection and/or diagnosis of SARS-CoV-2 by FDA under an Emergency Use Authorization (EUA). This EUA will remain  in effect (meaning this test can be used) for the duration of the COVID-19 declaration under Section 56 4(b)(1) of the Act, 21 U.S.C. section 360bbb-3(b)(1), unless the authorization is terminated or revoked sooner. Performed at [facility], [address] [place], [place] [address]     Radiology Studies: CT ABDOMEN PELVIS WO CONTRAST  Result Date: 08/01/2019 CLINICAL DATA:  Generalized abdominal pain. EXAM: CT ABDOMEN AND PELVIS WITHOUT CONTRAST TECHNIQUE: Multidetector CT imaging of the abdomen and pelvis was performed following the standard protocol without IV contrast. COMPARISON:  09/23/2007 FINDINGS: Lower chest: Small bilateral pleural effusions left greater than right with associated bibasilar atelectasis. Mild nodularity over the atelectatic change in the posterior right base measuring 2.6 cm as this may represent a true parenchymal nodule versus part of the atelectasis. 5 mm nodular density over the left lung base (image 37, series 5). Calcified plaque over the left main, left anterior descending and lateral circumflex coronary arteries. Calcified plaque over the descending thoracic aorta. Hepatobiliary: Previous cholecystectomy. Liver and biliary tree are unremarkable. Pancreas: Mild atrophy as the pancreas is otherwise unremarkable. Spleen: Normal. Adrenals/Urinary Tract: Adrenal glands are normal. Kidneys are normal size without hydronephrosis or focal mass. Subtle prominence of the intrarenal collecting systems and ureters bilaterally. Bladder is somewhat distended with small focus of air over the nondependent portion which could be seen due to recent instrumentation, infection or fistula with adjacent bowel. Moderate streak artifact over the pelvis from right hip prosthesis which obscures some of the pelvic structures. Stomach/Bowel: Stomach and  small bowel are normal. Appendix is normal. Minimal diverticulosis of the colon. Mild circumferential wall thickening of the rectum which may be due to proctitis. Mild fecal retention over the rectum. Vascular/Lymphatic: Mild-to-moderate calcified plaque over the abdominal aorta which is normal in caliber. No adenopathy. Reproductive: Previous prostatectomy. Other: Postsurgical changes over the soft tissues of the right hip compatible recent right hip arthroplasty. Musculoskeletal: Evidence of recent right hip arthroplasty intact. Mild degenerative change of the left hip. Degenerative change throughout the spine. Moderate compression fractures of T12 and L1 with mild compression deformities of L3 and L4 age indeterminate. IMPRESSION: 1. Mild bladder distention with small focus of air over the nondependent portion which can be seen due to recent instrumentation, infection or fistula with adjacent bowel. Recommend clinical correlation. 2. Mild circumferential rectal wall thickening with mild fecal retention over the rectum. Findings may be due to proctitis. 3. Small bilateral pleural effusions left greater than right with associated bibasilar atelectasis. 2.6 cm nodular component over the posterior right basilar consolidation which may represent a true parenchymal nodule versus part of the atelectasis. 5 mm nodular density over the left lower lobe. Recommend follow-up chest CT 4-6 weeks for re-evaluation. 4.  Mild colonic diverticulosis. 5. Moderate compression fractures of T12 and L1 with mild compression deformities of  L3 and L4 age indeterminate. 6. Aortic Atherosclerosis (ICD10-I70.0). Atherosclerotic coronary artery disease. 7. Postoperative changes over the right hip compatible with recent right hip arthroplasty. Electronically Signed   By: Elberta Fortisaniel  Boyle M.D.   On: 08/01/2019 09:34   Moorea Boissonneault T. Yaileen Hofferber [facility]  If 7PM-7AM, please contact night-coverage [email or web address] Password [email or web address] 08/01/2019, 12:12 PM

## 2019-08-01 NOTE — TOC Progression Note (Signed)
Transition of Care (TOC) - Progression Note    Patient Details  Name: Nicholas LAKEY Sr. MRN: [id number] Date of Birth: 06-08-1929  Transition of Care [facility]) CM/SW Contact  Baldemar [place], [place] Phone Number: 08/01/2019, 1:23 PM  Clinical Narrative:   CSW received call from [facility] with [facility] asking for update on patient and when he would admit back to them. Per chart review and MD note, plan for possible transition tomorrow back to [facility] with hospice if stable. Meg appreciative of update. CSW to follow.    Expected Discharge Plan: Assisted Living Barriers to Discharge: Continued Medical Work up  Expected Discharge Plan and Services Expected Discharge Plan: Assisted Living       Living arrangements for the past 2 months: Assisted Living Facility([facility])                                       Social Determinants of Health (SDOH) Interventions    Readmission Risk Interventions No flowsheet data found.

## 2019-08-02 DIAGNOSIS — G9341 Metabolic encephalopathy: Secondary | ICD-10-CM

## 2019-08-02 DIAGNOSIS — E440 Moderate protein-calorie malnutrition: Secondary | ICD-10-CM

## 2019-08-02 DIAGNOSIS — F0391 Unspecified dementia with behavioral disturbance: Secondary | ICD-10-CM

## 2019-08-02 DIAGNOSIS — L8990 Pressure ulcer of unspecified site, unspecified stage: Secondary | ICD-10-CM | POA: Insufficient documentation

## 2019-08-02 LAB — SARS CORONAVIRUS 2 (TAT 6-24 HRS): SARS Coronavirus 2: NEGATIVE

## 2019-08-02 MED ORDER — LORAZEPAM 1 MG PO TABS: 1.0000 mg | ORAL_TABLET | ORAL | 0 refills | Status: AC | PRN

## 2019-08-02 MED ORDER — ONDANSETRON 4 MG PO TBDP: 4.0000 mg | ORAL_TABLET | Freq: Four times a day (QID) | ORAL | 0 refills | Status: AC | PRN

## 2019-08-02 MED ORDER — FUROSEMIDE 20 MG PO TABS: 20.0000 mg | ORAL_TABLET | Freq: Every day | ORAL | 0 refills | Status: DC | PRN

## 2019-08-02 MED ORDER — MORPHINE SULFATE (CONCENTRATE) 10 MG /0.5 ML PO SOLN: 10.0000 mg | ORAL | 0 refills | Status: AC | PRN

## 2019-08-02 MED ORDER — ENSURE ENLIVE PO LIQD: 237.0000 mL | Freq: Two times a day (BID) | ORAL | 12 refills | Status: DC

## 2019-08-02 MED ORDER — SENNOSIDES-DOCUSATE SODIUM 8.6-50 MG PO TABS: 1.0000 | ORAL_TABLET | Freq: Two times a day (BID) | ORAL | 0 refills | Status: DC | PRN

## 2019-08-02 MED ORDER — GLYCOPYRROLATE 1 MG PO TABS: 1.0000 mg | ORAL_TABLET | ORAL | 0 refills | Status: AC | PRN

## 2019-08-02 NOTE — Plan of Care (Signed)
PATIENT CARE PLAN  Problem: Pain Managment: Goal: General experience of comfort will improve Outcome: Progressing   Problem: Health Behavior/Discharge Planning: Goal: Ability to manage health-related needs will improve Outcome: Not Progressing   Problem: Clinical Measurements: Goal: Ability to maintain clinical measurements within normal limits will improve Outcome: Not Progressing   Problem: Nutrition: Goal: Adequate nutrition will be maintained Outcome: Not Progressing   Problem: Education: Goal: Verbalization of understanding the information provided (i.e., activity precautions, restrictions, etc) will improve Outcome: Not Progressing Goal: Individualized Educational Video(s) Outcome: Not Progressing   Problem: Activity: Goal: Ability to ambulate and perform ADLs will improve Outcome: Not Progressing

## 2019-08-02 NOTE — Progress Notes (Signed)
Spoke with patients son, Linville Decarolis, and gave update on patients status, POC, and possible discharge plan. All questions answered at this time.

## 2019-08-02 NOTE — Discharge Summary (Signed)
Physician Discharge Summary  Nicholas OverlieJohn L Muff Sr. UJW:[id number]:[id number] DOB: 1928/10/23 DOA: 07/21/2019  PCP: Patient, No Pcp Per  Admit date: 07/21/2019 Discharge date: 08/02/2019  Admitted From: ALF Disposition: Residential hospice  Discharge Condition: End-of-life care CODE STATUS: DNR/DNI  Follow-up Information    Tarry KosXu, Naiping M, MD In 2 weeks.   Specialty: Orthopedic Surgery Why: For suture removal, For wound re-check Contact information: [address] [place] [id number] [phone]            Hospital Course: 86 year old male with history of dementia, alcohol abuse, CAD/stent, DM-2, recent compression fracture 3 to 4 months ago brought to ED from facility after unwitnessed fall and right femoral fracture.   Patient also hospitalized for compression fracture at [facility] and started to rehab 3 to 4 months ago. Underwent ORIF on 07/26/2019.  Also concern about HCAP based on initial chest x-ray although this was later thought to be due to pulmonary edema.  He completed empiric antibiotic with cefepime for 5 days.  Hospital course complicated by new A. Fib, new systolic CHF with EF of 25 to 84%30%, acute on chronic metabolic encephalopathy, dysphagia, hypernatremia and AKI.  Cardiology consulted and recommended diuretics and beta-blocker, and signed off.  Diuretics discontinued in the setting of hypernatremia.  He was a started on hypotonic solution and Aldactone without improvement in his hypernatremia.  He then developed AKI that didn't improve despite IV fluid and holding diuretics.  He also remained confused and disoriented with significant dysphagia.  After extensive discussion with patient's son, Fayrene FearingJames, he was eventually transitioned to full comfort care and transition to hospice.  See individual problem list below for more on hospital course. Discharge Diagnoses:  End-of-life care: DNR/DNI. -Full comfort measures with as needed morphine, Ativan,  glycopyrrolate and Zofran.  Fall at nursing home Closed displaced fracture of right femoral neck -Status post prosthetic replacement of femoral neck fracture on 2/15 as above  Acute respiratory failure with hypoxia-likely due to CHF exacerbation and possibly HCAP. -Empirically treated with cefepime for 5 days although infectious process thought to be less likely  Acute on chronic systolic CHF: Echo with EF of 25 to 30%, global hypokinesis, severe LAE, moderate RAE, normal RVSF.  Diuresed with IV Lasix which was discontinued due to hyponatremia and AKI.   New onset atrial fibrillation: HR in 90s to 100s  AKI: Cr 0.81 (admit) > 1.02 > 1.69> 1.81> 1.84.  BUN 70> 72.  Suggesting prerenal etiology.  Could be due to diuretics and A. Fib.  Renal ultrasound with mild bilateral hydro and trace perinephric stranding.  CT abdomen and pelvis with mild bladder distention and rectal wall thickening  Refractory hypernatremia: Na 154> >>150>.  Not responding to hypotonic fluid or Aldactone.  RLQ mass? and tenderness: No ultrasound or CT finding to explain this.  Acute metabolic encephalopathy in patient with history of dementia: Oriented to self only.  Reportedly has some baseline confusion.  B12 was normal.  RPR nonreactive.  Cortisol appropriately high.  TSH slightly high.  Free T4 normal.  Ammonia 37.  Hyperbilirubinemia: Sullivan LoneGilbert syndrome?  H&H stable.  Hyperkalemia: Slightly hemolyzed sample.  Alcohol use disorder: no EtOH for the last three months  Sepsis secondary to HCAP: POA  Uncontrolled DM-2 with hyperglycemia:  Essential hypertension: BP within fair range.  BPH with acute urinary retention-resolved.  Leukocytosis/bandemia: Likely demargination.  Improved.  Thrombocytopenia: Relatively stable  Goal of care discussion:  DNR/DNI/full comfort care  Moderate malnutrition: BMI 23.46.    Discharge  Instructions   Allergies as of 08/02/2019      Reactions    Atorvastatin Other (See Comments)   Myalgia Other reaction(s): Myalgias (intolerance)   Codeine Nausea And Vomiting      Medication List    STOP taking these medications   acetaminophen 500 MG tablet Commonly known as: TYLENOL   divalproex 125 MG capsule Commonly known as: DEPAKOTE SPRINKLE   escitalopram 10 MG tablet Commonly known as: LEXAPRO   ipratropium-albuterol 0.5-2.5 (3) MG/3ML Soln Commonly known as: DUONEB   LidoPatch Pain Relief 3.99-1.25 % Ptch Generic drug: Lidocaine-Menthol   memantine 5 MG tablet Commonly known as: NAMENDA   traMADol 50 MG tablet Commonly known as: ULTRAM     TAKE these medications   glycopyrrolate 1 MG tablet Commonly known as: ROBINUL Take 1 tablet (1 mg total) by mouth every 4 (four) hours as needed (excessive secretions).   LORazepam 1 MG tablet Commonly known as: ATIVAN Take 1 tablet (1 mg total) by mouth every 4 (four) hours as needed for anxiety. What changed:   medication strength  how much to take  when to take this  reasons to take this   morphine CONCENTRATE 10 mg / 0.5 ml concentrated solution Take 0.5 mLs (10 mg total) by mouth every 3 (three) hours as needed for moderate pain, severe pain or shortness of breath.   ondansetron 4 MG disintegrating tablet Commonly known as: ZOFRAN-ODT Take 1 tablet (4 mg total) by mouth every 6 (six) hours as needed for nausea.       Consultations:  Cardiology, orthopedic surgery  Procedures/Studies:  2D Echo on 07/22/2019 1. Left ventricular ejection fraction, by estimation, is 25 to 30%. The  left ventricle has severely decreased function. The left ventrical  demonstrates global hypokinesis. The left ventricular internal cavity size  was moderately dilated. There is mildly  increased left ventricular hypertrophy. indeterminate due to atrial Fib.  2. Right ventricular systolic function is normal. The right ventricular  size is normal.  3. Left atrial size was  severely dilated.  4. Right atrial size was moderately dilated.  5. The mitral valve is grossly normal. no evidence of mitral valve  regurgitation. No evidence of mitral stenosis.  6. The aortic valve is grossly normal. Aortic valve regurgitation is not  visualized. No aortic stenosis is present.  7. Evidence of atrial level shunting detected by color flow Doppler.    CT ABDOMEN PELVIS WO CONTRAST  Result Date: 08/01/2019 CLINICAL DATA:  Generalized abdominal pain. EXAM: CT ABDOMEN AND PELVIS WITHOUT CONTRAST TECHNIQUE: Multidetector CT imaging of the abdomen and pelvis was performed following the standard protocol without IV contrast. COMPARISON:  09/23/2007 FINDINGS: Lower chest: Small bilateral pleural effusions left greater than right with associated bibasilar atelectasis. Mild nodularity over the atelectatic change in the posterior right base measuring 2.6 cm as this may represent a true parenchymal nodule versus part of the atelectasis. 5 mm nodular density over the left lung base (image 37, series 5). Calcified plaque over the left main, left anterior descending and lateral circumflex coronary arteries. Calcified plaque over the descending thoracic aorta. Hepatobiliary: Previous cholecystectomy. Liver and biliary tree are unremarkable. Pancreas: Mild atrophy as the pancreas is otherwise unremarkable. Spleen: Normal. Adrenals/Urinary Tract: Adrenal glands are normal. Kidneys are normal size without hydronephrosis or focal mass. Subtle prominence of the intrarenal collecting systems and ureters bilaterally. Bladder is somewhat distended with small focus of air over the nondependent portion which could be seen due to recent instrumentation,  infection or fistula with adjacent bowel. Moderate streak artifact over the pelvis from right hip prosthesis which obscures some of the pelvic structures. Stomach/Bowel: Stomach and small bowel are normal. Appendix is normal. Minimal diverticulosis of the  colon. Mild circumferential wall thickening of the rectum which may be due to proctitis. Mild fecal retention over the rectum. Vascular/Lymphatic: Mild-to-moderate calcified plaque over the abdominal aorta which is normal in caliber. No adenopathy. Reproductive: Previous prostatectomy. Other: Postsurgical changes over the soft tissues of the right hip compatible recent right hip arthroplasty. Musculoskeletal: Evidence of recent right hip arthroplasty intact. Mild degenerative change of the left hip. Degenerative change throughout the spine. Moderate compression fractures of T12 and L1 with mild compression deformities of L3 and L4 age indeterminate. IMPRESSION: 1. Mild bladder distention with small focus of air over the nondependent portion which can be seen due to recent instrumentation, infection or fistula with adjacent bowel. Recommend clinical correlation. 2. Mild circumferential rectal wall thickening with mild fecal retention over the rectum. Findings may be due to proctitis. 3. Small bilateral pleural effusions left greater than right with associated bibasilar atelectasis. 2.6 cm nodular component over the posterior right basilar consolidation which may represent a true parenchymal nodule versus part of the atelectasis. 5 mm nodular density over the left lower lobe. Recommend follow-up chest CT 4-6 weeks for re-evaluation. 4.  Mild colonic diverticulosis. 5. Moderate compression fractures of T12 and L1 with mild compression deformities of L3 and L4 age indeterminate. 6. Aortic Atherosclerosis (ICD10-I70.0). Atherosclerotic coronary artery disease. 7. Postoperative changes over the right hip compatible with recent right hip arthroplasty. Electronically Signed   By: Marin Olp M.D.   On: 08/01/2019 09:34   CT Head Wo Contrast  Result Date: 07/21/2019 CLINICAL DATA:  Fall EXAM: CT HEAD WITHOUT CONTRAST CT CERVICAL SPINE WITHOUT CONTRAST TECHNIQUE: Multidetector CT imaging of the head and cervical spine  was performed following the standard protocol without intravenous contrast. Multiplanar CT image reconstructions of the cervical spine were also generated. COMPARISON:  None. FINDINGS: CT HEAD FINDINGS Brain: There is no mass, hemorrhage or extra-axial collection. There is generalized atrophy without lobar predilection. There is hypoattenuation of the periventricular white matter, most commonly indicating chronic ischemic microangiopathy. Vascular: Atherosclerotic calcification of the internal carotid arteries at the skull base. No abnormal hyperdensity of the major intracranial arteries or dural venous sinuses. Skull: The visualized skull base, calvarium and extracranial soft tissues are normal. Sinuses/Orbits: No fluid levels or advanced mucosal thickening of the visualized paranasal sinuses. No mastoid or middle ear effusion. The orbits are normal. CT CERVICAL SPINE FINDINGS Alignment: No static subluxation. Facets are aligned. Occipital condyles are normally positioned. Skull base and vertebrae: No acute fracture. Soft tissues and spinal canal: No prevertebral fluid or swelling. No visible canal hematoma. Disc levels: No advanced spinal canal or neural foraminal stenosis. Upper chest: No pneumothorax, pulmonary nodule or pleural effusion. Other: Normal visualized paraspinal cervical soft tissues. IMPRESSION: 1. No acute intracranial abnormality. 2. No fracture or static subluxation of the cervical spine. 3. Chronic ischemic microangiopathy and generalized volume loss. Electronically Signed   By: Ulyses Jarred M.D.   On: 07/21/2019 03:48   CT Cervical Spine Wo Contrast  Result Date: 07/21/2019 CLINICAL DATA:  Fall EXAM: CT HEAD WITHOUT CONTRAST CT CERVICAL SPINE WITHOUT CONTRAST TECHNIQUE: Multidetector CT imaging of the head and cervical spine was performed following the standard protocol without intravenous contrast. Multiplanar CT image reconstructions of the cervical spine were also generated. COMPARISON:   None.  FINDINGS: CT HEAD FINDINGS Brain: There is no mass, hemorrhage or extra-axial collection. There is generalized atrophy without lobar predilection. There is hypoattenuation of the periventricular white matter, most commonly indicating chronic ischemic microangiopathy. Vascular: Atherosclerotic calcification of the internal carotid arteries at the skull base. No abnormal hyperdensity of the major intracranial arteries or dural venous sinuses. Skull: The visualized skull base, calvarium and extracranial soft tissues are normal. Sinuses/Orbits: No fluid levels or advanced mucosal thickening of the visualized paranasal sinuses. No mastoid or middle ear effusion. The orbits are normal. CT CERVICAL SPINE FINDINGS Alignment: No static subluxation. Facets are aligned. Occipital condyles are normally positioned. Skull base and vertebrae: No acute fracture. Soft tissues and spinal canal: No prevertebral fluid or swelling. No visible canal hematoma. Disc levels: No advanced spinal canal or neural foraminal stenosis. Upper chest: No pneumothorax, pulmonary nodule or pleural effusion. Other: Normal visualized paraspinal cervical soft tissues. IMPRESSION: 1. No acute intracranial abnormality. 2. No fracture or static subluxation of the cervical spine. 3. Chronic ischemic microangiopathy and generalized volume loss. Electronically Signed   By: Deatra Robinson M.D.   On: 07/21/2019 03:48   US RENAL  Result Date: 07/30/2019 CLINICAL DATA:  Acute renal injury. EXAM: RENAL / URINARY TRACT ULTRASOUND COMPLETE COMPARISON:  CT report 08/29/2012. FINDINGS: Right Kidney: Renal measurements: 10.2 x 5.5 x 6.2 cm = volume: 180.7 mL . Echogenicity within normal limits. No mass. Mild hydronephrosis. Trace perinephric fluid. Left Kidney: Renal measurements: 11.0 x 5.7 x 5.9 cm = volume: 191.6 mL. Echogenicity within normal limits. No mass. Mild hydronephrosis. Trace perinephric fluid. Bladder: Appears normal for degree of bladder  distention. Ureteral jets not visualized. Other: None. IMPRESSION: Mild bilateral hydronephrosis. Trace perinephric fluid noted bilaterally. Electronically Signed   By: Maisie Fus  Register   On: 07/30/2019 15:00   Pelvis Portable  Result Date: 07/26/2019 CLINICAL DATA:  Right hip prosthesis. EXAM: PORTABLE PELVIS 1-2 VIEWS COMPARISON:  Intraoperative spot films, same date. FINDINGS: The bipolar hip prosthesis appears to be in good position without complicating features. No periprosthetic fracture. The left hip is intact. Advanced vascular calcifications. IMPRESSION: Bipolar right hip prosthesis in good position without complicating features. Electronically Signed   By: Rudie Meyer M.D.   On: 07/26/2019 16:49   DG CHEST PORT 1 VIEW  Result Date: 07/25/2019 CLINICAL DATA:  Dementia and shortness of breath. EXAM: PORTABLE CHEST 1 VIEW COMPARISON:  07/22/2019 FINDINGS: Stable cardiac enlargement. Bilateral hazy lung opacities which are favored to represent posterior layering pleural effusions are unchanged from previous exam. Left lung base retrocardiac opacity is stable. IMPRESSION: No change in bilateral pleural effusions and left lung base opacity. Electronically Signed   By: Signa Kell M.D.   On: 07/25/2019 09:52   DG Chest Port 1 View  Result Date: 07/22/2019 CLINICAL DATA:  Respiratory distress. EXAM: PORTABLE CHEST 1 VIEW COMPARISON:  Radiograph yesterday. FINDINGS: Cardiomegaly unchanged from yesterday. Aortic atherosclerosis and tortuosity. Progressive hazy opacities throughout both lungs likely combination of layering pleural effusions and edema. Pulmonary vasculature is indistinct. No evidence of pneumothorax. No displaced rib fracture. IMPRESSION: Progressive hazy opacities throughout both lungs likely combination of layering pleural effusions and pulmonary edema. Stable cardiomegaly since yesterday. Electronically Signed   By: Narda Rutherford M.D.   On: 07/22/2019 13:20   DG Chest Portable  1 View  Result Date: 07/21/2019 CLINICAL DATA:  Right femoral neck fracture EXAM: PORTABLE CHEST 1 VIEW COMPARISON:  Radiograph 07/28/2014 FINDINGS: There is diffuse hazy interstitial opacity with a perihilar predominance  as well as cephalized, indistinct pulmonary vascularity and slight obscuration of the costophrenic sulci. Moderate cardiomegaly is increased from comparison exam. The aorta is calcified. The remaining cardiomediastinal contours are unremarkable. No acute osseous or soft tissue abnormality. Degenerative changes are present in the imaged spine and shoulders. IMPRESSION: Features suggesting CHF/volume overload with edema and cardiomegaly. No acute traumatic findings in the chest. Electronically Signed   By: Kreg Shropshire M.D.   On: 07/21/2019 03:18   DG Shoulder Right Portable  Result Date: 07/21/2019 CLINICAL DATA:  Fall EXAM: PORTABLE RIGHT SHOULDER COMPARISON:  None. FINDINGS: Mild soft tissue thickening of the right acromioclavicular joint. There is no evidence of fracture or dislocation. Moderate glenohumeral and acromioclavicular arthrosis. No worrisome osseous lesions. Included portions of the right chest wall are free of acute abnormality. IMPRESSION: Soft tissue thickening at the right acromioclavicular joint may be degenerative in nature however should correlate for point tenderness to exclude a [place] type I acromioclavicular injury. No acute fracture or osseous malalignment is seen. Moderate glenohumeral and acromioclavicular arthrosis. Electronically Signed   By: Kreg Shropshire M.D.   On: 07/21/2019 03:13   DG Knee Right Port  Result Date: 07/21/2019 CLINICAL DATA:  Status post fall, pain. EXAM: PORTABLE RIGHT KNEE - 1-2 VIEW COMPARISON:  None. FINDINGS: Generalized osteopenia. No fracture or dislocation. No aggressive osseous lesion. Small joint effusion. Peripheral vascular atherosclerotic disease. Mild tricompartmental osteoarthritis of the right knee. IMPRESSION: No acute  osseous injury of the right knee. Electronically Signed   By: Elige Ko   On: 07/21/2019 08:52   DG Abd Portable 1V  Result Date: 07/30/2019 CLINICAL DATA:  Abdominal pain EXAM: PORTABLE ABDOMEN - 1 VIEW COMPARISON:  None. FINDINGS: The bowel gas pattern is normal. Cholecystectomy clips. No radio-opaque calculi or other significant radiographic abnormality are seen. IMPRESSION: Negative. Electronically Signed   By: Duanne Guess D.O.   On: 07/30/2019 14:17   DG C-Arm 1-60 Min  Result Date: 07/26/2019 CLINICAL DATA:  Right hip hemiarthroplasty. EXAM: OPERATIVE right HIP (WITH PELVIS IF PERFORMED) 2 VIEWS TECHNIQUE: Fluoroscopic spot image(s) were submitted for interpretation post-operatively. COMPARISON:  None. FINDINGS: Intraoperative fluoro spot images are submitted. Right hip hemiarthroplasty is noted. No fractures are present. IMPRESSION: Right hip hemiarthroplasty without radiographic evidence for complication. Electronically Signed   By: Marin Roberts M.D.   On: 07/26/2019 16:51   ECHOCARDIOGRAM COMPLETE  Result Date: 07/22/2019    ECHOCARDIOGRAM REPORT   Patient Name:   AXTON CIHLAR Sr. Date of Exam: 07/22/2019 Medical Rec #:  [id number]          Height: Accession #:    [id number]         Weight:       202.0 lb Date of Birth:  15-Sep-1928          BSA:          2.03 m Patient Age:    86 years           BP:           143/89 mmHg Patient Gender: M                  HR:           114 bpm. Exam Location:  Inpatient Procedure: 2D Echo Indications:    Cardiomegaly 429.3 / I51.7  History:        Patient has no prior history of Echocardiogram examinations.  CHF; Arrythmias:Atrial Fibrillation.  Sonographer:    Leeroy Bock Turrentine Referring Phys: [phone] LINDSAY B ROBERTS  Sonographer Comments: Image acquisition challenging due to uncooperative patient and Image acquisition challenging due to respiratory motion. IMPRESSIONS  1. Left ventricular ejection fraction, by estimation, is 25  to 30%. The left ventricle has severely decreased function. The left ventrical demonstrates global hypokinesis. The left ventricular internal cavity size was moderately dilated. There is mildly  increased left ventricular hypertrophy. indeterminate due to atrial Fib.  2. Right ventricular systolic function is normal. The right ventricular size is normal.  3. Left atrial size was severely dilated.  4. Right atrial size was moderately dilated.  5. The mitral valve is grossly normal. no evidence of mitral valve regurgitation. No evidence of mitral stenosis.  6. The aortic valve is grossly normal. Aortic valve regurgitation is not visualized. No aortic stenosis is present.  7. Evidence of atrial level shunting detected by color flow Doppler. FINDINGS  Left Ventricle: Left ventricular ejection fraction, by estimation, is 25 to 30%. The left ventricle has severely decreased function. The left ventricle demonstrates global hypokinesis. The left ventricular internal cavity size was moderately dilated. There is mildly increased left ventricular hypertrophy. Indeterminate due to atrial Fib. Right Ventricle: The right ventricular size is normal. No increase in right ventricular wall thickness. Right ventricular systolic function is normal. Left Atrium: Left atrial size was severely dilated. Right Atrium: Right atrial size was moderately dilated. Pericardium: There is no evidence of pericardial effusion. Mitral Valve: The mitral valve is grossly normal. No evidence of mitral valve regurgitation. No evidence of mitral valve stenosis. Tricuspid Valve: The tricuspid valve is not well visualized. Tricuspid valve regurgitation is not demonstrated. Aortic Valve: The aortic valve is grossly normal. Aortic valve regurgitation is not visualized. No aortic stenosis is present. Moderate aortic valve annular calcification. Pulmonic Valve: The pulmonic valve was grossly normal. Pulmonic valve regurgitation is trivial. Aorta: The aortic  root, ascending aorta and aortic arch are all structurally normal, with no evidence of dilitation or obstruction. IAS/Shunts: Evidence of atrial level shunting detected by color flow Doppler.  LEFT VENTRICLE PLAX 2D LVIDd:         5.82 cm LVIDs:         4.88 cm LV PW:         1.23 cm LV IVS:        1.20 cm LVOT diam:     2.20 cm LVOT Area:     3.80 cm  LEFT ATRIUM            Index LA diam:      6.20 cm  3.05 cm/m LA Vol (A4C): 101.0 ml 49.69 ml/m   AORTA Ao Root diam: 2.70 cm  SHUNTS Systemic Diam: 2.20 cm Kristeen Miss MD Electronically signed by Kristeen Miss MD Signature Date/Time: 07/22/2019/11:05:43 AM    Final    DG HIP OPERATIVE UNILAT W OR W/O PELVIS RIGHT  Result Date: 07/26/2019 CLINICAL DATA:  Right hip hemiarthroplasty. EXAM: OPERATIVE right HIP (WITH PELVIS IF PERFORMED) 2 VIEWS TECHNIQUE: Fluoroscopic spot image(s) were submitted for interpretation post-operatively. COMPARISON:  None. FINDINGS: Intraoperative fluoro spot images are submitted. Right hip hemiarthroplasty is noted. No fractures are present. IMPRESSION: Right hip hemiarthroplasty without radiographic evidence for complication. Electronically Signed   By: Marin Roberts M.D.   On: 07/26/2019 16:51   DG HIP UNILAT WITH PELVIS 2-3 VIEWS RIGHT  Result Date: 07/21/2019 CLINICAL DATA:  Fall EXAM: DG HIP (WITH OR WITHOUT PELVIS) 2-3V  RIGHT COMPARISON:  None. FINDINGS: There is a transcervical right femoral neck fracture with varus angulation and external rotation across the fracture line. No other acute fracture is seen of the bony pelvis or proximal left femur. Femoral heads remain normally located. Normal bowel gas pattern. Extensive vascular calcium in the pelvis. Soft tissue swelling of the right hip is noted. IMPRESSION: Transcervical right femoral neck fracture with varus angulation and external rotation across the fracture line. Absence Electronically Signed   By: Kreg Shropshire M.D.   On: 07/21/2019 03:11      Discharge  Exam: Vitals:   08/01/19 0745 08/02/19 0800  BP:  131/79  Pulse: (!) 56 99  Resp:  14  Temp:  (!) 97.5 F (36.4 C)  SpO2: 96% 93%    GENERAL: Chronically ill-appearing.  No apparent distress. RESP:  No IWOB.  NEURO: Awake but confused and disoriented.Marland Kitchen PSYCH: Calm.  No distress or agitation.   The results of significant diagnostics from this hospitalization (including imaging, microbiology, ancillary and laboratory) are listed below for reference.     Microbiology: Recent Results (from the past 240 hour(s))  Surgical pcr screen     Status: None   Collection Time: 07/26/19 11:46 AM   Specimen: Nasal Mucosa; Nasal Swab  Result Value Ref Range Status   MRSA, PCR NEGATIVE NEGATIVE Final   Staphylococcus aureus NEGATIVE NEGATIVE Final    Comment: (NOTE) The Xpert SA Assay (FDA approved for NASAL specimens in patients 69 years of age and older), is one component of a comprehensive surveillance program. It is not intended to diagnose infection nor to guide or monitor treatment. Performed at [facility], [address], [place], [place] [address]   SARS CORONAVIRUS 2 (TAT 6-24 HRS) Nasopharyngeal Nasopharyngeal Swab     Status: None   Collection Time: 07/27/19  3:30 PM   Specimen: Nasopharyngeal Swab  Result Value Ref Range Status   SARS Coronavirus 2 NEGATIVE NEGATIVE Final    Comment: (NOTE) SARS-CoV-2 target nucleic acids are NOT DETECTED. The SARS-CoV-2 RNA is generally detectable in upper and lower respiratory specimens during the acute phase of infection. Negative results do not preclude SARS-CoV-2 infection, do not rule out co-infections with other pathogens, and should not be used as the sole basis for treatment or other patient management decisions. Negative results must be combined with clinical observations, patient history, and epidemiological information. The expected result is Negative. Fact Sheet for  Patients: HairSlick.no Fact Sheet for Healthcare Providers: quierodirigir.com This test is not yet approved or cleared by the [place] FDA and  has been authorized for detection and/or diagnosis of SARS-CoV-2 by FDA under an Emergency Use Authorization (EUA). This EUA will remain  in effect (meaning this test can be used) for the duration of the COVID-19 declaration under Section 56 4(b)(1) of the Act, 21 U.S.C. section 360bbb-3(b)(1), unless the authorization is terminated or revoked sooner. Performed at [facility], [address], [place], [place] [address]      Labs: BNP (last 3 results) Recent Labs    07/21/19 0239  BNP 655.7*   Basic Metabolic Panel: Recent Labs  Lab 07/28/19 0346 07/29/19 0444 07/30/19 1055 07/31/19 0355 08/01/19 0323  NA 154* 154* 153* 154* 150*  K 3.4* 3.4* 3.1* 4.2 5.8*  CL 106 111 111 114* 112*  CO2 34* 30 32 28 27  GLUCOSE 143* 179* 240* 186* 197*  BUN 43* 52* 70* 74* 72*  CREATININE 0.89 1.02 1.69* 1.81* 1.84*  CALCIUM 9.2 9.1 8.8* 8.8* 8.6*  MG  --  2.2 2.3 2.3 2.4  PHOS  --  2.1* 2.6 2.6 2.3*   Liver Function Tests: Recent Labs  Lab 07/29/19 0444 07/30/19 1055 07/31/19 0355 08/01/19 0323  AST 25  --   --   --   ALT 36  --   --   --   ALKPHOS 73  --   --   --   BILITOT 2.1*  --   --   --   PROT 5.9*  --   --   --   ALBUMIN 2.9*  3.0* 2.6* 2.6* 2.7*   No results for input(s): LIPASE, AMYLASE in the last 168 hours. Recent Labs  Lab 07/28/19 1626  AMMONIA 37*   CBC: Recent Labs  Lab 07/28/19 0346 07/29/19 0444 07/30/19 0636 07/31/19 0355 08/01/19 0323  WBC 17.0* 14.0* 13.8* 13.3* 14.9*  HGB 15.4 15.4 16.2 13.9 14.2  HCT 47.6 47.4 50.0 42.1 43.7  MCV 101.7* 99.2 99.4 99.1 99.5  PLT 119* 107* 101* 92* 83*   Cardiac Enzymes: No results for input(s): CKTOTAL, CKMB, CKMBINDEX, TROPONINI in the last 168 hours. BNP: Invalid input(s):  POCBNP CBG: Recent Labs  Lab 07/31/19 1155 07/31/19 1724 07/31/19 2137 08/01/19 0802 08/01/19 1159  GLUCAP 212* 183* 126* 184* 166*   D-Dimer No results for input(s): DDIMER in the last 72 hours. Hgb A1c No results for input(s): HGBA1C in the last 72 hours. Lipid Profile No results for input(s): CHOL, HDL, LDLCALC, TRIG, CHOLHDL, LDLDIRECT in the last 72 hours. Thyroid function studies No results for input(s): TSH, T4TOTAL, T3FREE, THYROIDAB in the last 72 hours.  Invalid input(s): FREET3 Anemia work up No results for input(s): VITAMINB12, FOLATE, FERRITIN, TIBC, IRON, RETICCTPCT in the last 72 hours. Urinalysis    Component Value Date/Time   COLORURINE AMBER (A) 07/22/2019 1535   APPEARANCEUR HAZY (A) 07/22/2019 1535   LABSPEC 1.027 07/22/2019 1535   PHURINE 5.0 07/22/2019 1535   GLUCOSEU NEGATIVE 07/22/2019 1535   HGBUR LARGE (A) 07/22/2019 1535   BILIRUBINUR NEGATIVE 07/22/2019 1535   KETONESUR 5 (A) 07/22/2019 1535   PROTEINUR 100 (A) 07/22/2019 1535   NITRITE NEGATIVE 07/22/2019 1535   LEUKOCYTESUR TRACE (A) 07/22/2019 1535   Sepsis Labs Invalid input(s): PROCALCITONIN,  WBC,  LACTICIDVEN   Time coordinating discharge: 40 minutes  SIGNED:  Almon Herculesaye T Anyela Napierkowski, MD  [facility] 08/02/2019, 10:31 AM  If 7PM-7AM, please contact night-coverage [email or web address] Password [email or web address]

## 2019-08-02 NOTE — TOC Transition Note (Signed)
Transition of Care [facility]) - CM/SW Discharge Note   Patient Details  Name: Nicholas Trujillo. MRN: [id number] Date of Birth: Jul 08, 1928  Transition of Care [facility]) CM/SW Contact:  Nonda Lou, LCSW Phone Number: 08/02/2019, 5:27 PM   Clinical Narrative:    Patient will DC to: [facility] Anticipated DC date: 08/02/2019 Family notified: Jonny Ruiz, son  Transport by: Sharin Mons   Per MD patient ready for DC to [place] with hospice. RN, patient, patient's family, and facility notified of DC. Discharge Summary and FL2 sent to facility. RN to call report prior to discharge [phone]). DC packet on chart. Ambulance transport requested for patient.   CSW will sign off for now as social work intervention is no longer needed. Please consult [place] again if new needs arise.    Final next level of care: Hospice Medical Facility Barriers to Discharge: No Barriers Identified   Patient Goals and CMS Choice   CMS Medicare.gov Compare Post Acute Care list provided to:: Patient Represenative (must comment)(Demetris, son) Choice offered to / list presented to : Adult Children  Discharge Placement              Patient chooses bed at: [facility] Patient to be transferred to facility by: [facility] Name of family member notified: Anastacio, son Patient and family notified of of transfer: 08/02/19  Discharge Plan and Services                                     Social Determinants of Health (SDOH) Interventions     Readmission Risk Interventions No flowsheet data found.

## 2019-08-02 NOTE — Progress Notes (Signed)
Report called in to nurse, Cordelia Pen, over at [facility]. Patient has been transported back via EMS.

## 2019-08-02 NOTE — TOC Progression Note (Signed)
Transition of Care (TOC) - Progression Note    Patient Details  Name: Nicholas LATULIPPE Sr. MRN: [id number] Date of Birth: Jul 12, 1928  [facility]) CM/SW Contact  Nonda Lou, [place] Phone Number: 08/02/2019, 11:20 AM  Clinical Narrative:    CSW spoke with Sonda Primes [phone] and confirmed patient can return with hospice services. She requested patient have an updated Covid test prior to return. CSW will continue to follow and assist with discharge needs.   Expected Discharge Plan: Assisted Living Barriers to Discharge: Continued Medical Work up  Expected Discharge Plan and Services Expected Discharge Plan: Assisted Living       Living arrangements for the past 2 months: Assisted Living Facility([facility]) Expected Discharge Date: 07/30/19                                     Social Determinants of Health (SDOH) Interventions    Readmission Risk Interventions No flowsheet data found.

## 2019-08-03 LAB — VITAMIN B1: Vitamin B1 (Thiamine): 153.1 nmol/L (ref 66.5–200.0)

## 2019-08-09 DEATH — deceased

## 2019-09-09 DEATH — deceased

## 2021-03-02 IMAGING — DX DG CHEST 1V PORT
1 series · 1 of 1 positions shown · non-contrast
Comparison: Radiograph 07/28/2014

CLINICAL DATA: Right femoral neck fracture

EXAM:
PORTABLE CHEST 1 VIEW

[chest ap]
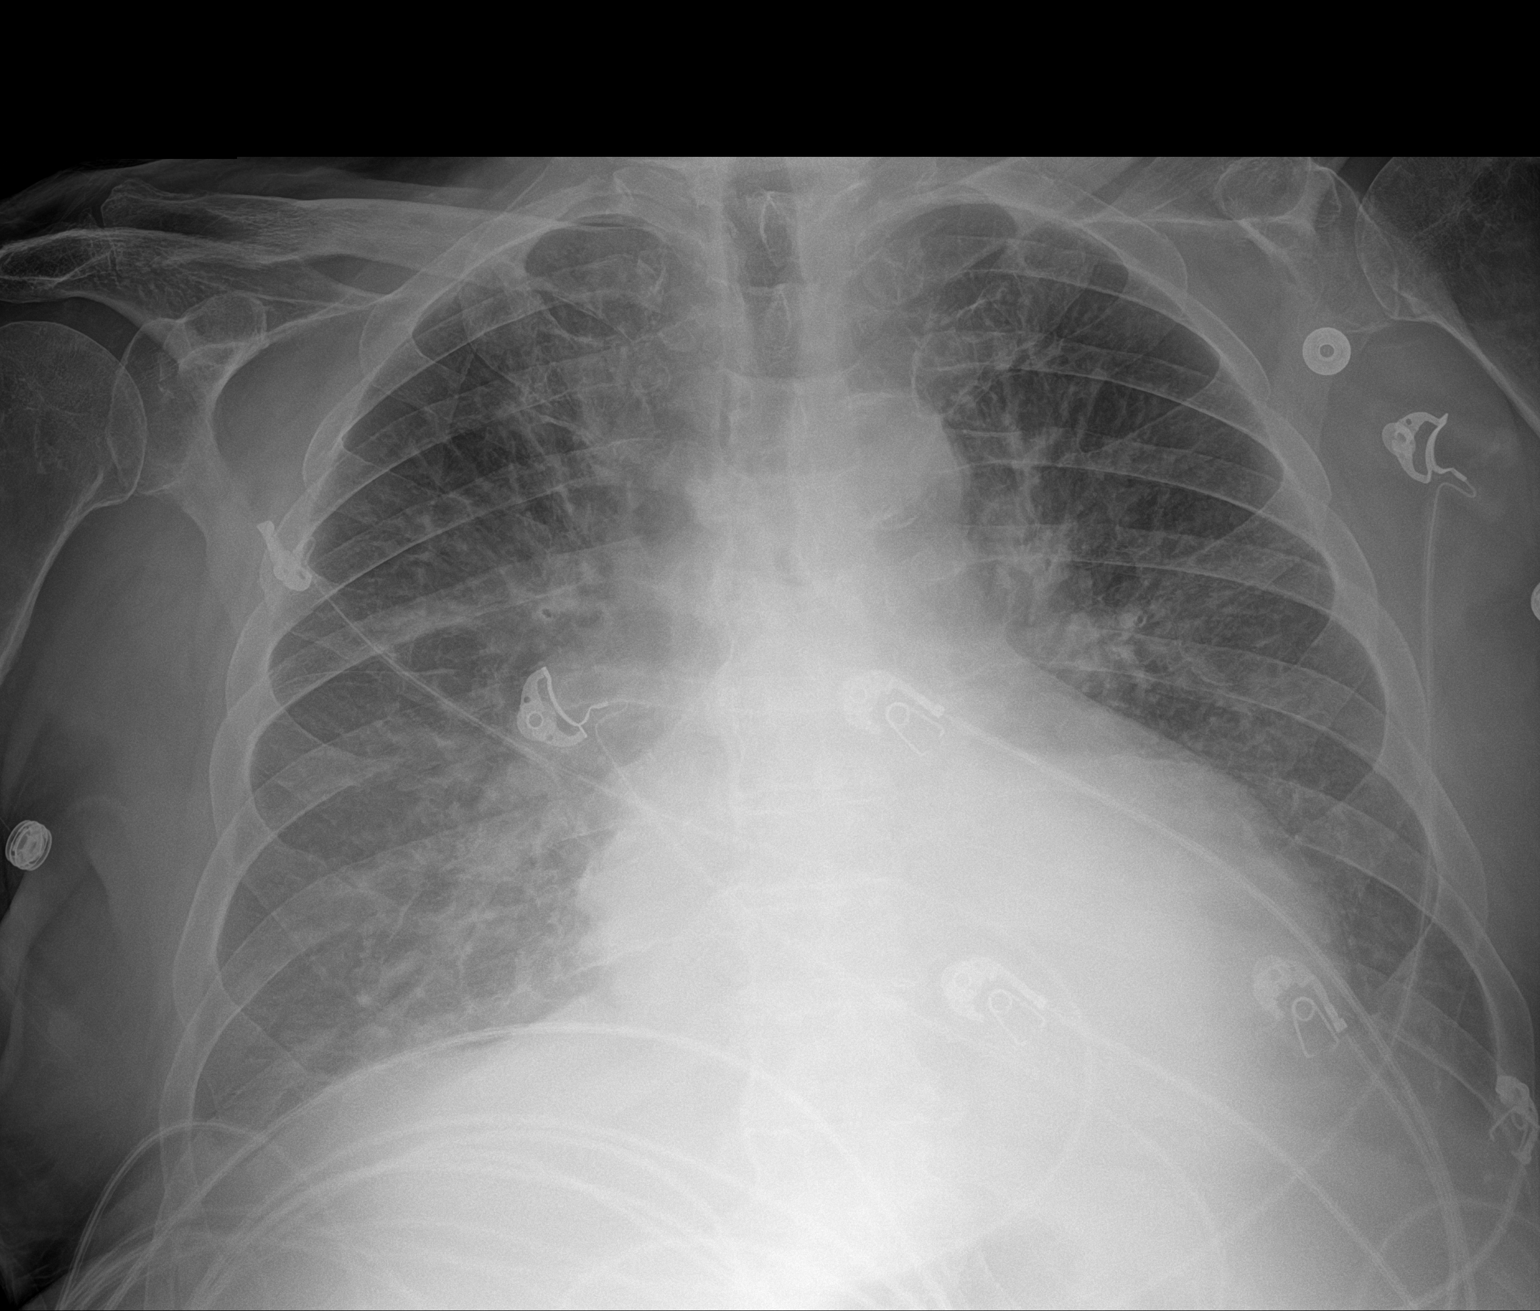

[1 of 1 positions shown; findings below may reference images not displayed]

FINDINGS: There is diffuse hazy interstitial opacity with a perihilar
predominance as well as cephalized, indistinct pulmonary vascularity
and slight obscuration of the costophrenic sulci. Moderate
cardiomegaly is increased from comparison exam. The aorta is
calcified. The remaining cardiomediastinal contours are
unremarkable. No acute osseous or soft tissue abnormality.
Degenerative changes are present in the imaged spine and shoulders.
IMPRESSION: Features suggesting CHF/volume overload with edema and cardiomegaly.

No acute traumatic findings in the chest.

## 2021-03-02 IMAGING — DX DG HIP (WITH OR WITHOUT PELVIS) 2-3V*R*
3 series · 3 of 3 positions shown · non-contrast
Comparison: None.

CLINICAL DATA: Fall

EXAM:
DG HIP (WITH OR WITHOUT PELVIS) 2-3V RIGHT

[pelvis ap]
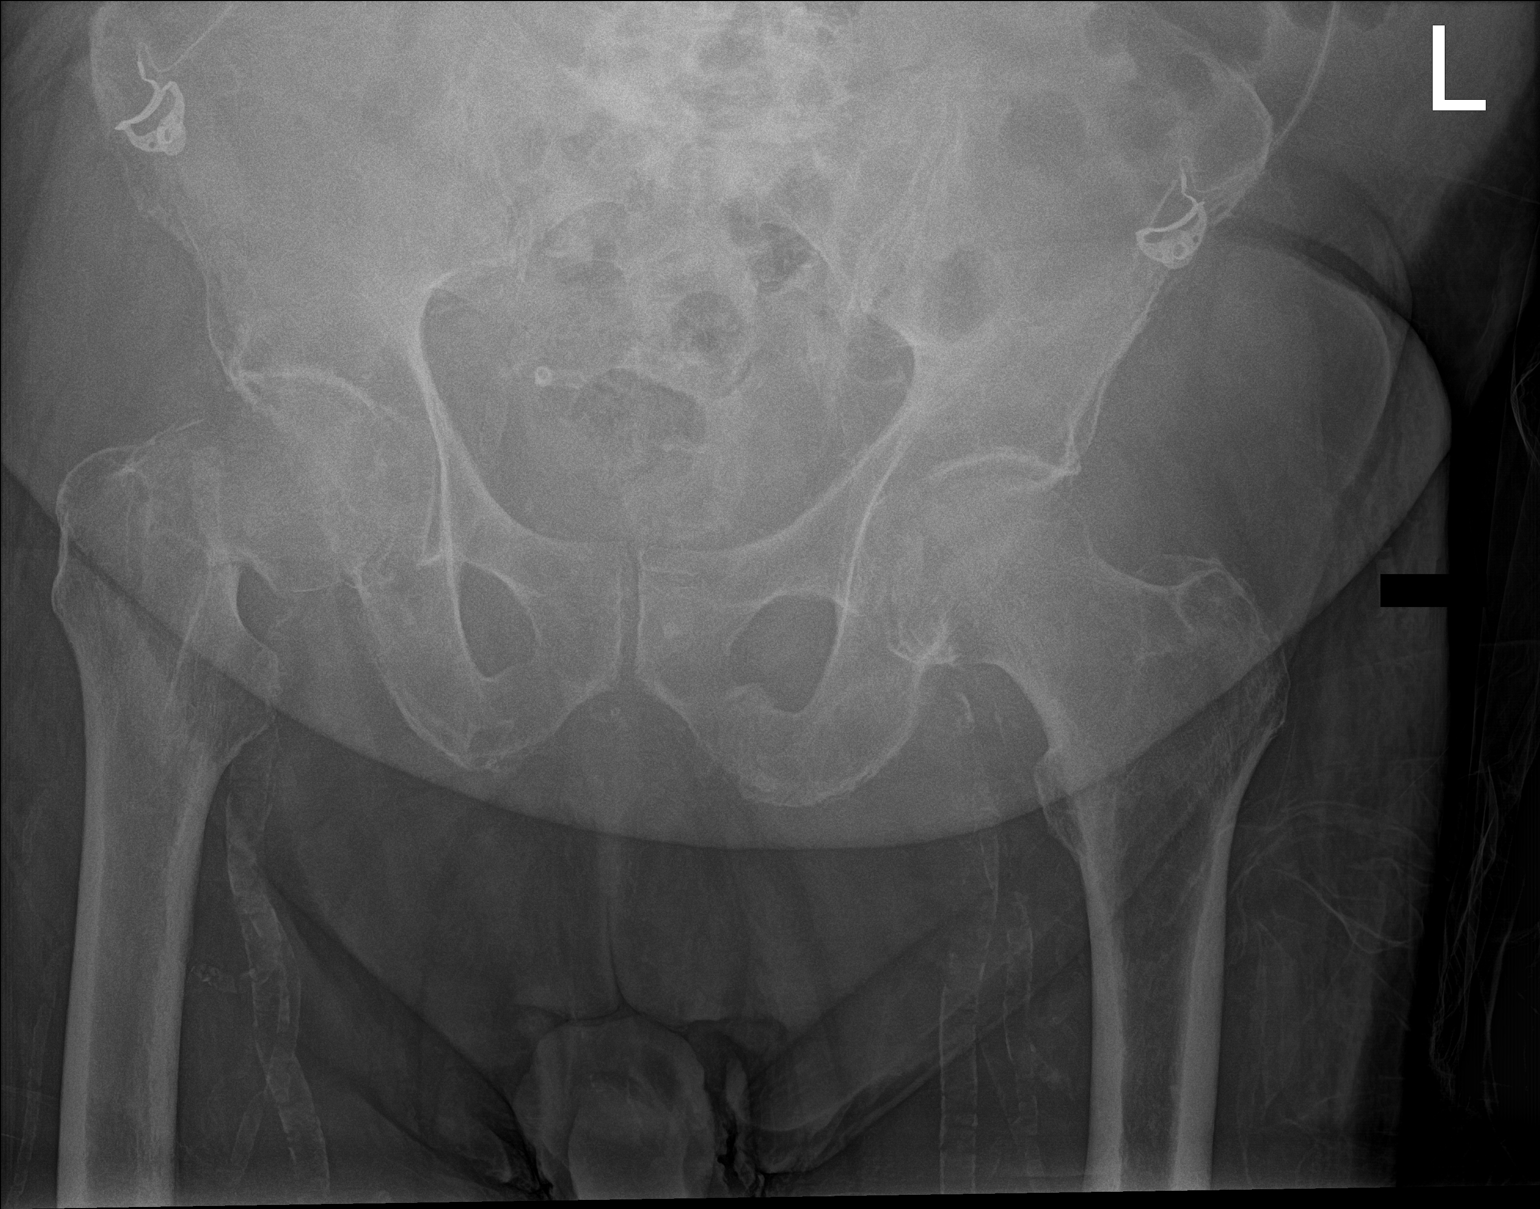

[hip ap]
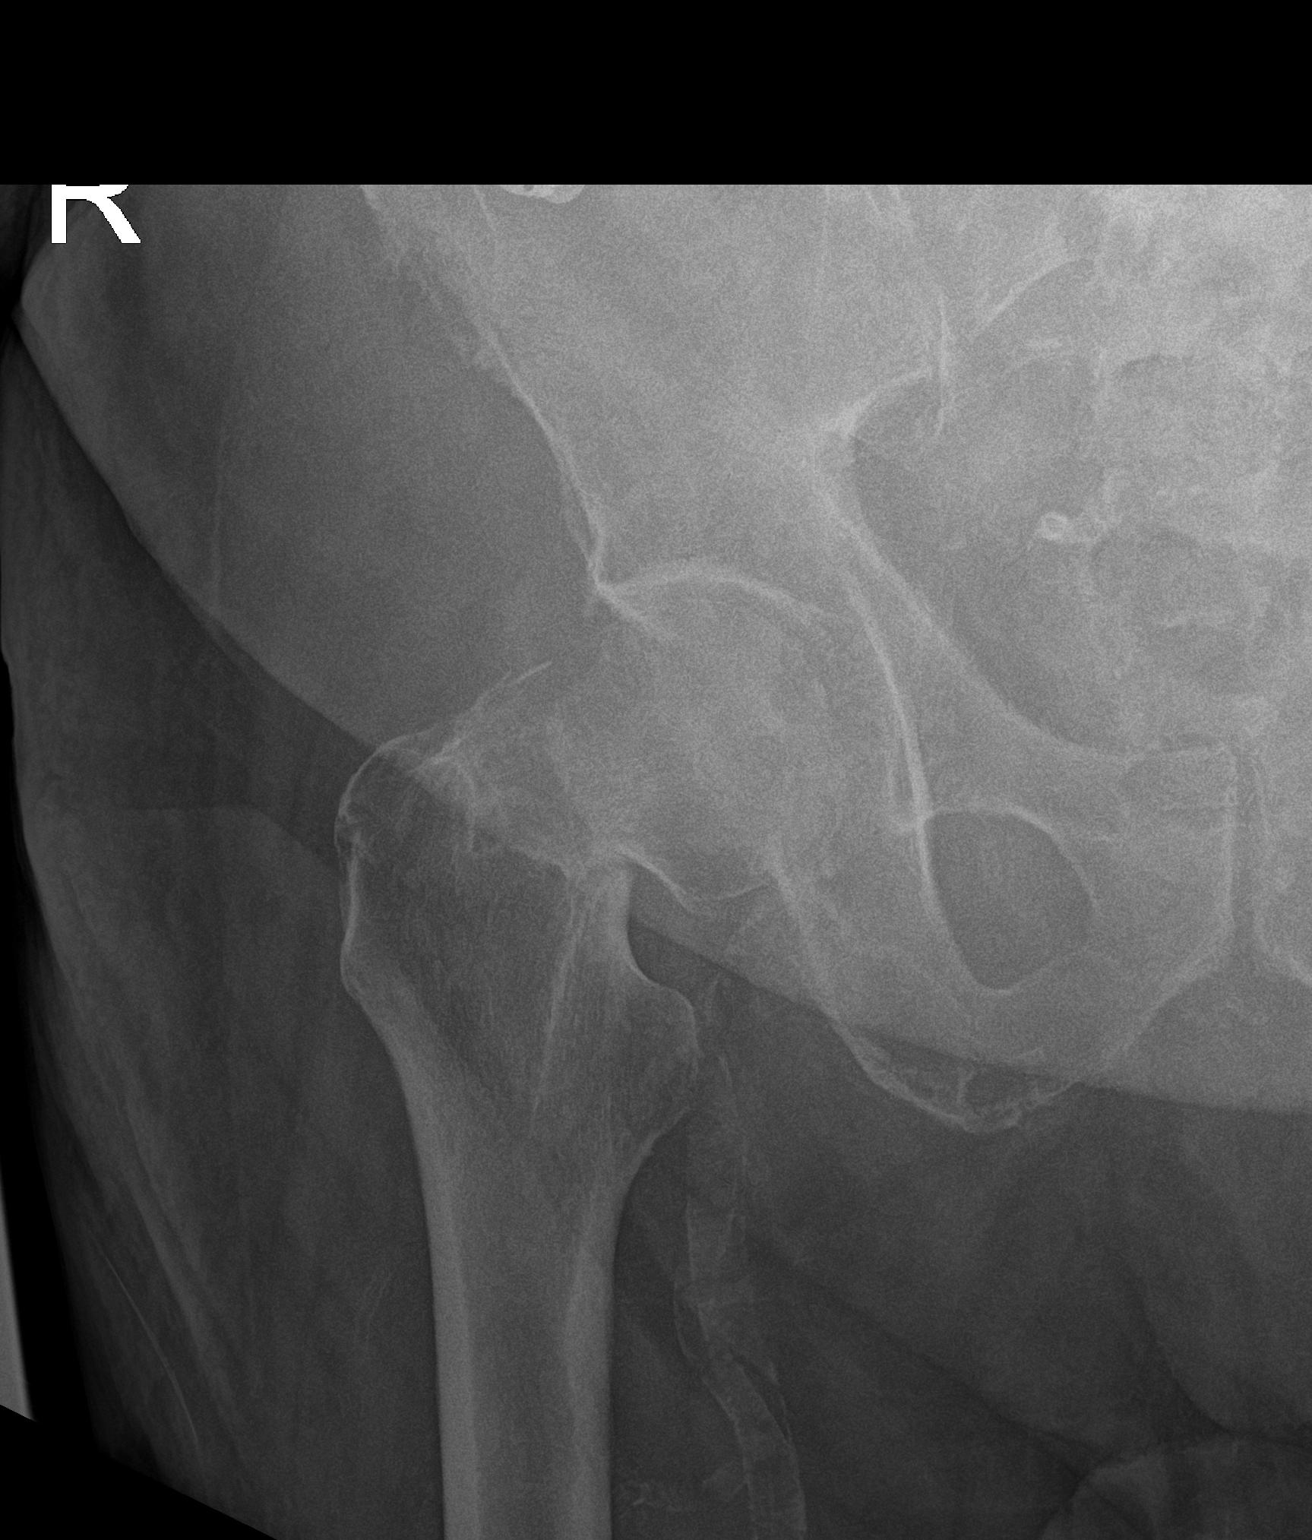

[hip lat]
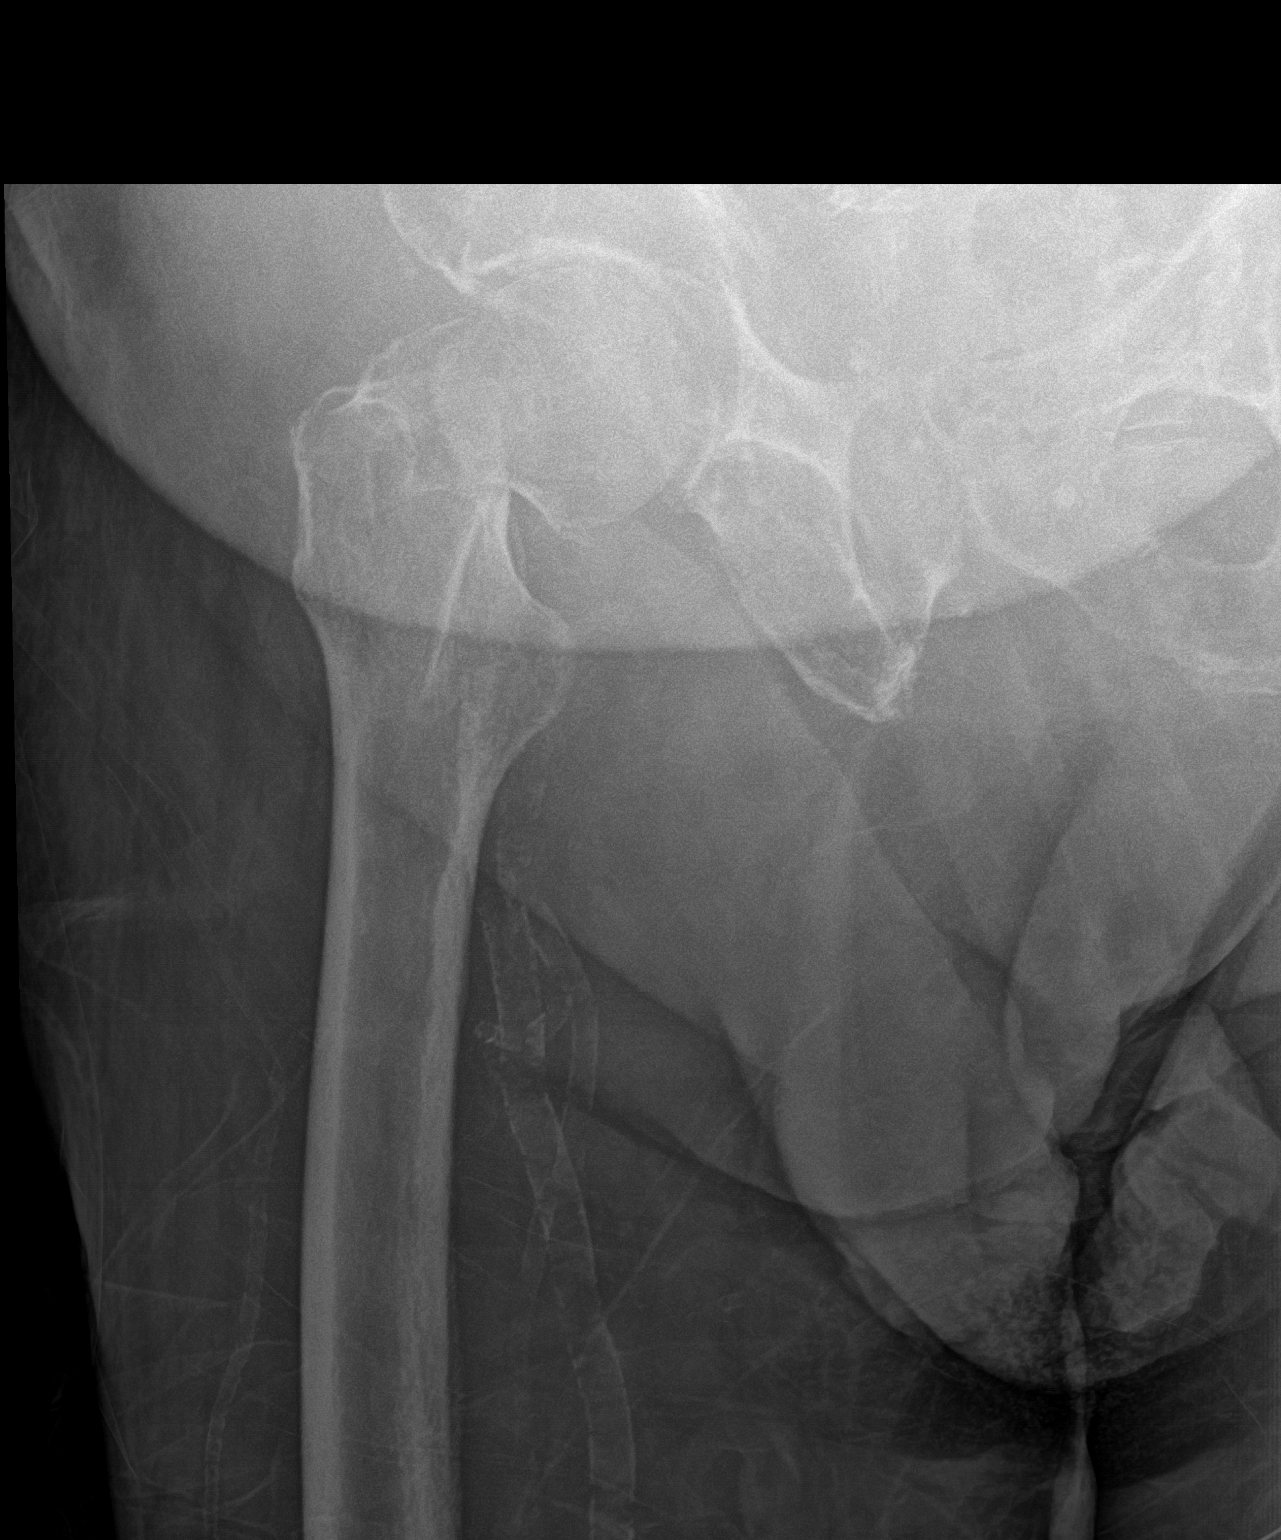

[3 of 3 positions shown; findings below may reference images not displayed]

FINDINGS: There is a transcervical right femoral neck fracture with varus
angulation and external rotation across the fracture line. No other
acute fracture is seen of the bony pelvis or proximal left femur.
Femoral heads remain normally located. Normal bowel gas pattern.
Extensive vascular calcium in the pelvis. Soft tissue swelling of
the right hip is noted.
IMPRESSION: Transcervical right femoral neck fracture with varus angulation and
external rotation across the fracture line. Absence

## 2021-03-03 IMAGING — DX DG CHEST 1V PORT
1 series · 1 of 1 positions shown · non-contrast
Comparison: Radiograph yesterday.

CLINICAL DATA: Respiratory distress.

EXAM:
PORTABLE CHEST 1 VIEW

[chest ap]
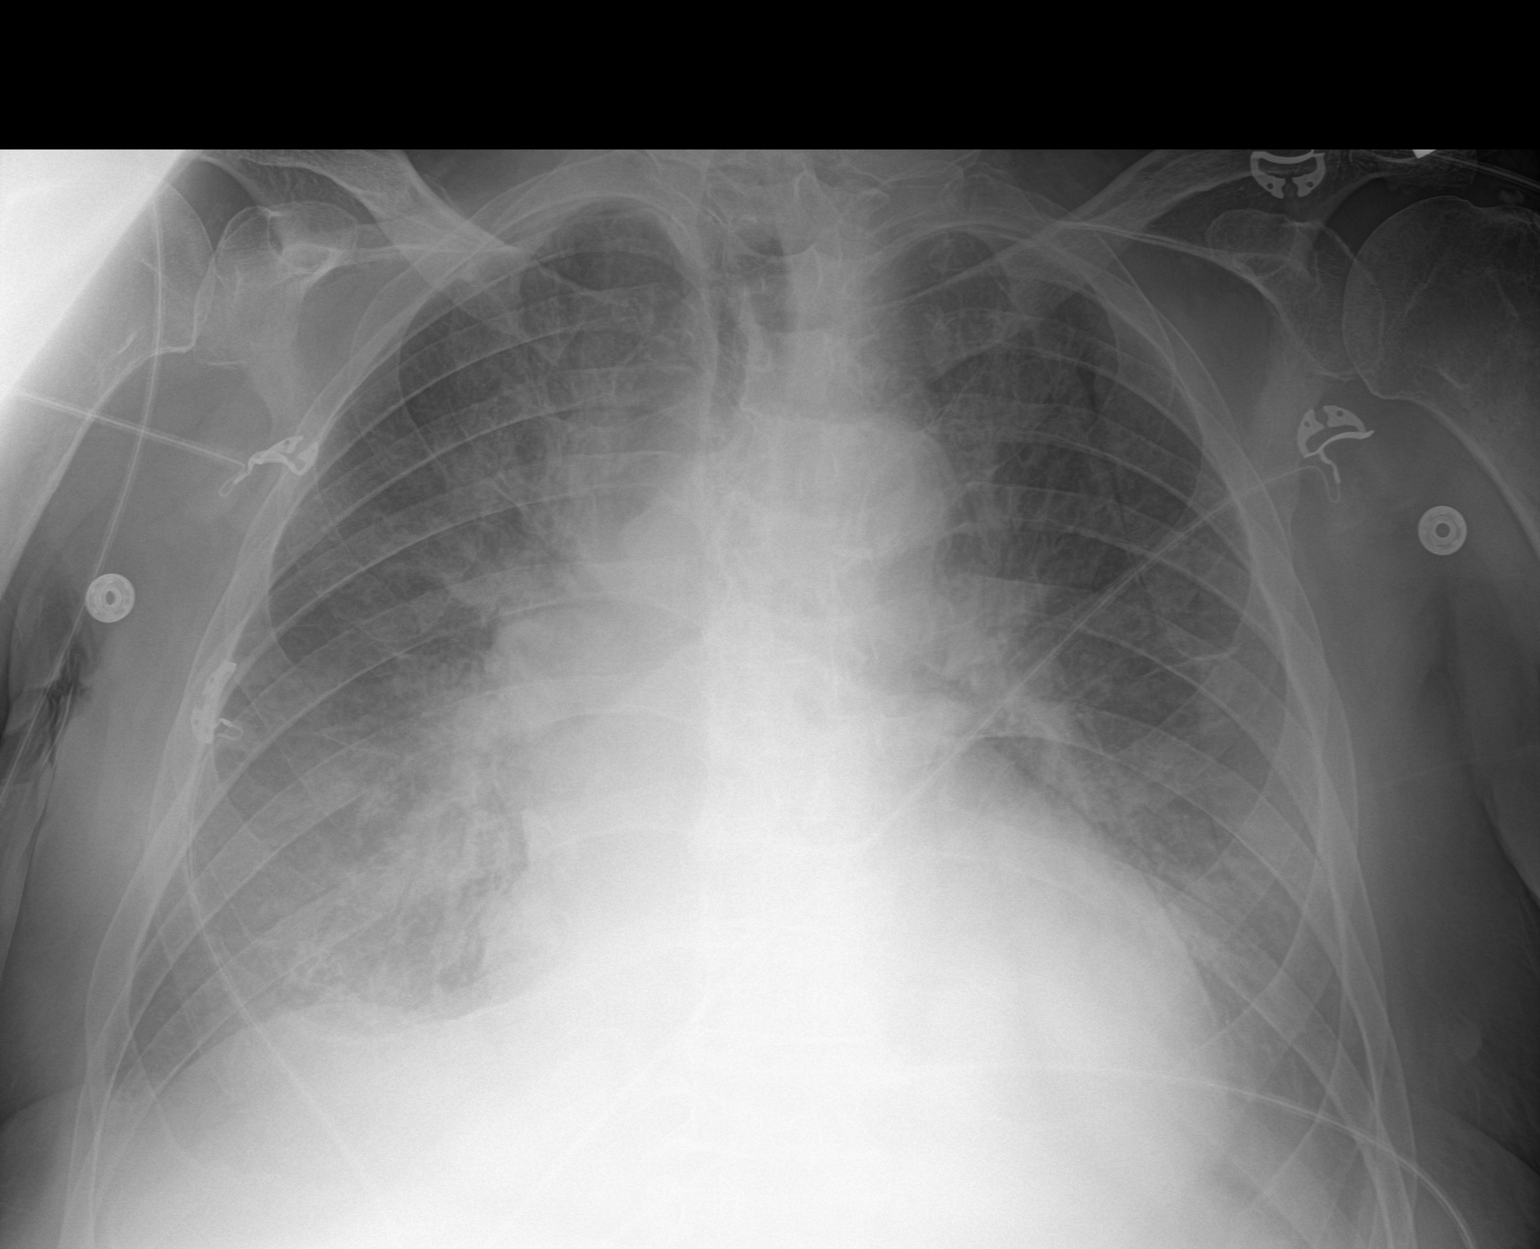

[1 of 1 positions shown; findings below may reference images not displayed]

FINDINGS: Cardiomegaly unchanged from yesterday. Aortic atherosclerosis and
tortuosity. Progressive hazy opacities throughout both lungs likely
combination of layering pleural effusions and edema. Pulmonary
vasculature is indistinct. No evidence of pneumothorax. No displaced
rib fracture.
IMPRESSION: Progressive hazy opacities throughout both lungs likely combination
of layering pleural effusions and pulmonary edema. Stable
cardiomegaly since yesterday.
